# Patient Record
Sex: Female | Born: 1970 | Race: White | Hispanic: No | Smoking: Never smoker
Health system: Southern US, Community
[De-identification: ages and names within clinical notes are randomized; demographics above are authoritative.]

## PROBLEM LIST (undated history)

## (undated) DIAGNOSIS — G459 Transient cerebral ischemic attack, unspecified: Secondary | ICD-10-CM

## (undated) DIAGNOSIS — M1990 Unspecified osteoarthritis, unspecified site: Secondary | ICD-10-CM

## (undated) HISTORY — PX: OTHER SURGICAL HISTORY: SHX169

## (undated) HISTORY — DX: Unspecified osteoarthritis, unspecified site: M19.90

---

## 2002-04-29 ENCOUNTER — Inpatient Hospital Stay (HOSPITAL_COMMUNITY): Admission: AD | Admit: 2002-04-29 | Discharge: 2002-04-29 | Payer: Self-pay | Admitting: Obstetrics and Gynecology

## 2004-09-12 ENCOUNTER — Ambulatory Visit (HOSPITAL_COMMUNITY): Admission: RE | Admit: 2004-09-12 | Discharge: 2004-09-12 | Payer: Self-pay | Admitting: Family Medicine

## 2010-09-10 ENCOUNTER — Inpatient Hospital Stay (INDEPENDENT_AMBULATORY_CARE_PROVIDER_SITE_OTHER)
Admission: RE | Admit: 2010-09-10 | Discharge: 2010-09-10 | Disposition: A | Discharge status: Home / Self Care | Payer: Self-pay | Source: Ambulatory Visit | Attending: Emergency Medicine | Admitting: Emergency Medicine

## 2010-09-10 ENCOUNTER — Encounter: Payer: Self-pay | Admitting: Emergency Medicine

## 2010-09-10 DIAGNOSIS — R05 Cough: Secondary | ICD-10-CM

## 2010-12-23 NOTE — Progress Notes (Signed)
Summary: cough   Vital Signs:  Patient Profile:   40 Years Old Female CC:      dry cough x 2 weeks Height:     62 inches Weight:      125 pounds O2 Sat:      98 % O2 treatment:    Room Air Temp:     98.5 degrees F oral Pulse rate:   81 / minute Resp:     16 per minute BP sitting:   128 / 77  (left arm) Cuff size:   regular  Pt. in pain?   no  Vitals Entered By: Lajean Saver RN (September 10, 2010 10:33 AM)                   Updated Prior Medication List: No Medications Current Allergies: No known allergies History of Present Illness History from: patient Chief Complaint: dry cough x 2 weeks History of Present Illness: 40 Years Old Female complains of onset of cough 10-14 days.  Terri Garner has been using nothing OTC.  Perhaps is a little worse at night.  It is dry.  No other symptoms noted.  No history of allergic rhinitis, GERD, or asthma.  She is not getting worse, just an annoying cough at night. No sore throat + dry cough No pleuritic pain No wheezing No nasal congestion No post-nasal drainage No sinus pain/pressure No chest congestion No itchy/red eyes No earache No hemoptysis No SOB No chills/sweats No fever No nausea No vomiting No abdominal pain No diarrhea No skin rashes No fatigue No myalgias No headache    REVIEW OF SYSTEMS Constitutional Symptoms      Denies fever, chills, night sweats, weight loss, weight gain, and fatigue.  Eyes       Denies change in vision, eye pain, eye discharge, glasses, contact lenses, and eye surgery. Ear/Nose/Throat/Mouth       Denies hearing loss/aids, change in hearing, ear pain, ear discharge, dizziness, frequent runny nose, frequent nose bleeds, sinus problems, sore throat, hoarseness, and tooth pain or bleeding.  Respiratory       Complains of dry cough and shortness of breath.      Denies productive cough, wheezing, asthma, bronchitis, and emphysema/COPD.  Cardiovascular       Denies murmurs, chest pain, and  tires easily with exhertion.    Gastrointestinal       Denies stomach pain, nausea/vomiting, diarrhea, constipation, blood in bowel movements, and indigestion. Genitourniary       Denies painful urination, kidney stones, and loss of urinary control. Neurological       Denies paralysis, seizures, and fainting/blackouts. Musculoskeletal       Denies muscle pain, joint pain, joint stiffness, decreased range of motion, redness, swelling, muscle weakness, and gout.  Skin       Denies bruising, unusual mles/lumps or sores, and hair/skin or nail changes.  Psych       Denies mood changes, temper/anger issues, anxiety/stress, speech problems, depression, and sleep problems. Other Comments: Dry cough x 2 weeks. No other symptoms. 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 She was not bitten   Past History:  Past Medical History: arthritis  Past Surgical History: Denies surgical history  Social History: Never Smoked Alcohol use-no Drug use-no Smoking Status:  never Drug Use:  no Physical Exam General appearance: well developed, well nourished, no acute distress Ears: normal, no lesions or deformities Nasal: mucosa pink, nonedematous, no  septal deviation, turbinates normal Oral/Pharynx: tongue normal, posterior pharynx without erythema or exudate Chest/Lungs: no rales, wheezes, or rhonchi bilateral, breath sounds equal without effort Heart: regular rate and  rhythm, no murmur MSE: oriented to time, place, and person Assessment New Problems: COUGH (ICD-786.2)   Plan New Medications/Changes: TESSALON 200 MG CAPS (BENZONATATE) 1 by mouth three times a day as needed for cough  #21 x 0, 09/10/2010, Hoyt Koch MD  New Orders: New Patient Level III [phone] Pulse Oximetry (single measurment) [94760] Planning Comments:   No antibiotic given since likely allergic/irritant. Use nasal saline solution (over the counter) at least 3  times a day and consider an antihistamine at night. Follow up with your primary doctor  if no improvement in 5-7 days, sooner if increasing pain, fever, or new symptoms.   If not improving, can try Cheratussin vs a trial of Zpak.   She was around a horse (fully immunized) that was bleeding a few weeks ago.  She doesn't have any symptoms of brucellosis, the horse and her don't have any manifestations of pulmonary anthrax.  Perhaps early leptospirosis is possible but no other symptoms like fever.  If worsening, should be worked up with labs, CXR.   The patient and/or caregiver has been counseled thoroughly with regard to medications prescribed including dosage, schedule, interactions, rationale for use, and possible side effects and they verbalize understanding.  Diagnoses and expected course of recovery discussed and will return if not improved as expected or if the condition worsens. Patient and/or caregiver verbalized understanding.  Prescriptions: TESSALON 200 MG CAPS (BENZONATATE) 1 by mouth three times a day as needed for cough  #21 x 0   Entered and Authorized by:   Hoyt Koch MD   Signed by:   Hoyt Koch MD on 09/10/2010   Method used:   Print then Give to Patient   RxID:   [id number]   Orders Added: 1)  New Patient Level III [95621] 2)  Pulse Oximetry (single measurment) [30865]

## 2012-09-16 ENCOUNTER — Emergency Department (HOSPITAL_COMMUNITY): Payer: BC Managed Care – PPO

## 2012-09-16 ENCOUNTER — Emergency Department (HOSPITAL_BASED_OUTPATIENT_CLINIC_OR_DEPARTMENT_OTHER)
Admission: EM | Admit: 2012-09-16 | Discharge: 2012-09-16 | Disposition: A | Discharge status: Nursing Home (Includes ICF) | Payer: BC Managed Care – PPO | Source: Home / Self Care

## 2012-09-16 ENCOUNTER — Encounter (HOSPITAL_COMMUNITY): Payer: Self-pay | Admitting: *Deleted

## 2012-09-16 ENCOUNTER — Encounter (HOSPITAL_COMMUNITY): Payer: Self-pay | Admitting: Emergency Medicine

## 2012-09-16 ENCOUNTER — Observation Stay (HOSPITAL_COMMUNITY)
Admission: EM | Admit: 2012-09-16 | Discharge: 2012-09-17 | Disposition: A | Discharge status: Home / Self Care | Payer: BC Managed Care – PPO | Attending: Internal Medicine | Admitting: Internal Medicine

## 2012-09-16 DIAGNOSIS — G459 Transient cerebral ischemic attack, unspecified: Secondary | ICD-10-CM

## 2012-09-16 DIAGNOSIS — R7309 Other abnormal glucose: Secondary | ICD-10-CM | POA: Insufficient documentation

## 2012-09-16 DIAGNOSIS — R7303 Prediabetes: Secondary | ICD-10-CM | POA: Diagnosis present

## 2012-09-16 DIAGNOSIS — F449 Dissociative and conversion disorder, unspecified: Secondary | ICD-10-CM

## 2012-09-16 DIAGNOSIS — E785 Hyperlipidemia, unspecified: Secondary | ICD-10-CM | POA: Diagnosis present

## 2012-09-16 DIAGNOSIS — F447 Conversion disorder with mixed symptom presentation: Secondary | ICD-10-CM

## 2012-09-16 DIAGNOSIS — R51 Headache: Secondary | ICD-10-CM | POA: Insufficient documentation

## 2012-09-16 DIAGNOSIS — R29810 Facial weakness: Secondary | ICD-10-CM | POA: Insufficient documentation

## 2012-09-16 DIAGNOSIS — R29898 Other symptoms and signs involving the musculoskeletal system: Secondary | ICD-10-CM | POA: Insufficient documentation

## 2012-09-16 DIAGNOSIS — R4789 Other speech disturbances: Secondary | ICD-10-CM | POA: Insufficient documentation

## 2012-09-16 DIAGNOSIS — G43109 Migraine with aura, not intractable, without status migrainosus: Secondary | ICD-10-CM

## 2012-09-16 DIAGNOSIS — R110 Nausea: Secondary | ICD-10-CM | POA: Insufficient documentation

## 2012-09-16 LAB — COMPREHENSIVE METABOLIC PANEL
AST: 16 U/L (ref 0–37)
Albumin: 4.2 g/dL (ref 3.5–5.2)
Alkaline Phosphatase: 54 U/L (ref 39–117)
BUN: 12 mg/dL (ref 6–23)
Chloride: 102 mEq/L (ref 96–112)
Creatinine, Ser: 0.54 mg/dL (ref 0.50–1.10)
GFR calc Af Amer: >90 mL/min (ref >90)
Potassium: 3.5 mEq/L (ref 3.5–5.1)
Total Bilirubin: 0.4 mg/dL (ref 0.3–1.2)
Total Protein: 7.6 g/dL (ref 6.0–8.3)

## 2012-09-16 LAB — CBC WITH DIFFERENTIAL/PLATELET
Eosinophils Absolute: 0.1 10*3/uL (ref 0.0–0.7)
Lymphocytes Relative: 24 % (ref 12–46)
MCV: 90.1 fL (ref 78.0–100.0)
Monocytes Absolute: 0.8 10*3/uL (ref 0.1–1.0)
Monocytes Relative: 9 % (ref 3–12)
Neutro Abs: 5.5 10*3/uL (ref 1.7–7.7)
Neutrophils Relative %: 65 % (ref 43–77)
RBC: 4.55 MIL/uL (ref 3.87–5.11)

## 2012-09-16 LAB — POCT PREGNANCY, URINE: Preg Test, Ur: NEGATIVE

## 2012-09-16 NOTE — ED Notes (Signed)
I called [facility] and spoke to [employer]. He said her initial BP was 158/102 and came down to 128/86.  Her CBG was 132. Pt. notified of these results.

## 2012-09-16 NOTE — ED Provider Notes (Signed)
Medical screening examination/treatment/procedure(s) were performed by resident physician or non-physician practitioner and as supervising physician I was immediately available for consultation/collaboration.   Laiklyn Pilkenton DOUGLAS MD.   Aeson Sawyers D Layth Cerezo, MD 09/16/12 2052 

## 2012-09-16 NOTE — ED Provider Notes (Signed)
CSN: [id number]     Arrival date & time 09/16/12  1655 History   None    Chief Complaint  Patient presents with  . Near Syncope  . Spasms  . Headache   (Consider location/radiation/quality/duration/timing/severity/associated sxs/prior Treatment) HPI  Today developed a queezy stomach around 12:40 . Developed HA shortly thereafter on the back L side of head. Felt like she was going to pass out. 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  Started have intermitten arms, hands, shoulders, back, and leg muscle spasms that occurred in a controlled fashion. Remembers entire event. Told that L side of face started to droop. No h/o previous occurrence. Symptoms started to resolve around 1:30. HA returning. Denies any recent gfevers, night sweats, unexplained wt change, n/v/d/c, CP, palpitations, LOC, SOB. No photo/phono sensitivity.  Endorses ~ 6 hours of sleep a night lately which is improved over typical amount. Denies any coffee use 2 cans of Dr pepper a day. No recent vitamin or medication use/changes.      History reviewed. No pertinent past medical history. History reviewed. No pertinent past surgical history. Family History  Problem Relation Age of Onset  . Diabetes Father   . Heart attack Father    History  Substance Use Topics  . Smoking status: Never Smoker   . Smokeless tobacco: Not on file  . Alcohol Use: No   OB History   Grav Para Term Preterm Abortions TAB SAB Ect Mult Living                 Review of Systems  All other systems reviewed and are negative.    Allergies  Review of patient's allergies indicates no known allergies.  Home Medications   Current Outpatient Rx  Name  Route  Sig  Dispense  Refill  . ibuprofen (ADVIL,MOTRIN) 200 MG tablet   Oral   Take 400 mg by mouth once.          BP 131/85  Pulse 71  Temp(Src) 97.8 F (36.6 C) (Oral)  Resp 20  SpO2 100%  LMP 09/12/2012 Physical Exam  Constitutional: She is oriented  to person, place, and time. She appears well-developed and well-nourished. No distress.  HENT:  Head: Normocephalic and atraumatic.  Mouth/Throat: Oropharynx is clear and moist.  Eyes: Conjunctivae and EOM are normal. Pupils are equal, round, and reactive to light.  Neck: Normal range of motion. No JVD present. No thyromegaly present.  Cardiovascular: Normal rate, normal heart sounds and intact distal pulses.   Pulmonary/Chest: Effort normal and breath sounds normal. No respiratory distress. She has no wheezes. She has no rales. She exhibits no tenderness.  Abdominal: Soft. Bowel sounds are normal. She exhibits no distension. There is no tenderness.  Musculoskeletal: Normal range of motion. She exhibits no tenderness.  Lymphadenopathy:    She has no cervical adenopathy.  Neurological: She is alert and oriented to person, place, and time. No cranial nerve deficit.  Rhomberg neg, proprioception nml, cerebellar fxn intact. EOMI, PERRL.   Skin: Skin is warm and dry. No rash noted. She is not diaphoretic. No erythema. No pallor.  Psychiatric: She has a normal mood and affect. Her behavior is normal. Judgment and thought content normal.    ED Course  Procedures (including critical care time) Labs Review Labs Reviewed - No data to display Imaging Review No results found.  MDM  No diagnosis found. 42yo F w/ likely psychosomatic or conversion disorder vs migraine. No signs or symptoms  of stroke or seizure as no postictal state and pt w/o any deficits. 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  - Pt would like further workup for reasurrance at [facility] ED. - transfer to [facility] ED  Shelly Flatten, MD Family Medicine PGY-3 09/16/2012, 6:43 PM   Spent more than in direct pt care and counseling    Ozella Rocks, MD 09/16/12 1846

## 2012-09-16 NOTE — ED Notes (Signed)
Consc., alert, oriented x 4 and amb., PERL, equal smile, good bil. grip, no arm drift.

## 2012-09-16 NOTE — ED Notes (Signed)
PT. TRANSFERRED FROM [facility] THIS EVENING , REPORTS HEADACHE , NAUSEA , DIZZINESS WITH BRIEF LEFT FACIAL DROOP ONSET 12 NOON TODAY , SPEECH CLEAR / NO FACIAL ASYMMETRY , EQUAL STRONG GRIPS , NO ARM DRIFT /AMBULATORY . EKG DONE AT URGENT CARE.

## 2012-09-16 NOTE — ED Notes (Signed)
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  When another teacher said her name, she could not respond.  Legs were elevated.  Started feeling muscle spasms all over her body and L side of face drooped. EMS was called and evaluate her.  Drooping in her face resolved and she refused transport. Muscle shaking resolved in 1 hr.  She took 2 Advill @ 1400 with partial relief.  No nausea now but still has slight headache.

## 2012-09-16 NOTE — ED Provider Notes (Signed)
CSN: [id number]     Arrival date & time 09/16/12  1903 History   First MD Initiated Contact with Patient 09/16/12 2339     Chief Complaint  Patient presents with  . Nausea  . Headache   (Consider location/radiation/quality/duration/timing/severity/associated sxs/prior Treatment) HPI History provided by patient. She is otherwise healthy, denies any medications or history of migraine headaches. She works as a Engineer, site, Product manager. Today around 10 AM developed nausea and left-sided posterior headache. Gradual onset it became severe around 1 PM. She denies thunderclap headache. She denies worse headache of life. She denies any neck pain or neck stiffness. She was getting ready for a medium and colleagues cold her that she had left facial droop. At that time she was unable to speak but she could hear people talking around her. Those symptoms lasted a matter of minutes and resolved. EMS was called and patient declined transport. She later went to urgent care and was referred here for further workup. Her headache is now 4/10 and she declines any medications for this. Nausea is resolved. No associated fevers, recent illness, recent travel. No weakness or numbness otherwise. No history of same  History reviewed. No pertinent past medical history. History reviewed. No pertinent past surgical history. Family History  Problem Relation Age of Onset  . Diabetes Father   . Heart attack Father    History  Substance Use Topics  . Smoking status: Never Smoker   . Smokeless tobacco: Not on file  . Alcohol Use: No   OB History   Grav Para Term Preterm Abortions TAB SAB Ect Mult Living                 Review of Systems  Constitutional: Negative for fever and chills.  HENT: Negative for neck pain and neck stiffness.   Eyes: Negative for pain.  Respiratory: Negative for shortness of breath.   Cardiovascular: Negative for chest pain.  Gastrointestinal: Negative for abdominal pain.   Genitourinary: Negative for dysuria.  Musculoskeletal: Negative for back pain.  Skin: Negative for rash.  Neurological: Negative for headaches.  All other systems reviewed and are negative.    Allergies  Review of patient's allergies indicates no known allergies.  Home Medications   Current Outpatient Rx  Name  Route  Sig  Dispense  Refill  . ibuprofen (ADVIL,MOTRIN) 200 MG tablet   Oral   Take 400 mg by mouth once.          BP 129/77  Pulse 65  Temp(Src) 97.5 F (36.4 C) (Oral)  Resp 16  SpO2 99%  LMP 09/12/2012 Physical Exam  Constitutional: She is oriented to person, place, and time. She appears well-developed and well-nourished.  HENT:  Head: Normocephalic and atraumatic.  Eyes: Conjunctivae and EOM are normal. Pupils are equal, round, and reactive to light.  Neck: Full passive range of motion without pain. Neck supple. No thyromegaly present.  No meningismus  Cardiovascular: Normal rate, regular rhythm, S1 normal, S2 normal and intact distal pulses.   Pulmonary/Chest: Effort normal and breath sounds normal.  Abdominal: Soft. Bowel sounds are normal. There is no tenderness. There is no CVA tenderness.  Musculoskeletal: Normal range of motion.  Neurological: She is alert and oriented to person, place, and time. She has normal strength and normal reflexes. No cranial nerve deficit or sensory deficit. She displays a negative Romberg sign. GCS eye subscore is 4. GCS verbal subscore is 5. GCS motor subscore is 6.  Normal Gait  Skin: Skin  is warm and dry. No rash noted. No cyanosis. Nails show no clubbing.  Psychiatric: She has a normal mood and affect. Her speech is normal and behavior is normal.    ED Course  Procedures (including critical care time) Labs Review  Results for orders placed during the hospital encounter of 09/16/12  CBC WITH DIFFERENTIAL      Result Value Range   WBC 8.4  4.0 - 10.5 K/uL   RBC 4.55  3.87 - 5.11 MIL/uL   Hemoglobin 14.1  12.0 -  15.0 g/dL   HCT 62.9  52.8 - 41.3 %   MCV 90.1  78.0 - 100.0 fL   MCH 31.0  26.0 - 34.0 pg   MCHC 34.4  30.0 - 36.0 g/dL   RDW 24.4  01.0 - 27.2 %   Platelets 332  150 - 400 K/uL   Neutrophils Relative % 65  43 - 77 %   Neutro Abs 5.5  1.7 - 7.7 K/uL   Lymphocytes Relative 24  12 - 46 %   Lymphs Abs 2.0  0.7 - 4.0 K/uL   Monocytes Relative 9  3 - 12 %   Monocytes Absolute 0.8  0.1 - 1.0 K/uL   Eosinophils Relative 1  0 - 5 %   Eosinophils Absolute 0.1  0.0 - 0.7 K/uL   Basophils Relative 1  0 - 1 %   Basophils Absolute 0.0  0.0 - 0.1 K/uL  COMPREHENSIVE METABOLIC PANEL      Result Value Range   Sodium 139  135 - 145 mEq/L   Potassium 3.5  3.5 - 5.1 mEq/L   Chloride 102  96 - 112 mEq/L   CO2 26  19 - 32 mEq/L   Glucose, Bld 93  70 - 99 mg/dL   BUN 12  6 - 23 mg/dL   Creatinine, Ser 5.36  0.50 - 1.10 mg/dL   Calcium 9.4  8.4 - 64.4 mg/dL   Total Protein 7.6  6.0 - 8.3 g/dL   Albumin 4.2  3.5 - 5.2 g/dL   AST 16  0 - 37 U/L   ALT 8  0 - 35 U/L   Alkaline Phosphatase 54  39 - 117 U/L   Total Bilirubin 0.4  0.3 - 1.2 mg/dL   GFR calc non Af Amer >90  >90 mL/min   GFR calc Af Amer >90  >90 mL/min  POCT PREGNANCY, URINE      Result Value Range   Preg Test, Ur NEGATIVE  NEGATIVE   Ct Head (brain) Wo Contrast  09/16/2012   *RADIOLOGY REPORT*  Clinical Data:  Nausea, left posterior headache, pain below eyes, near-syncope, episode of aphasia  CT HEAD WITHOUT CONTRAST  Technique:  Contiguous axial images were obtained from the base of the skull through the vertex without contrast.  Comparison: None  Findings: Normal ventricular morphology. No midline shift or mass effect. Normal appearance of brain parenchyma. No intracranial hemorrhage, mass lesion, or acute infarction. Visualized paranasal sinuses and mastoid air cells clear. Bones unremarkable.  IMPRESSION: No acute intracranial abnormalities.   Original Report Authenticated By: Ulyses Southward, M.D.     Date: 09/16/2012  Rate: 75   Rhythm: normal sinus rhythm  QRS Axis: normal  Intervals: normal  ST/T Wave abnormalities: nonspecific ST changes  Conduction Disutrbances:none  Narrative Interpretation:   Old EKG Reviewed: none available  ASA provided  12:15 AM d/w NEU on call as above, DR Leroy Kennedy agrees with admit TIA work up. MED  consult requested. 12:36 AM d/w Toniann Fail, will admit MDM  Dx: TIA, HA, nausea  CT brain, labs, ECG Serial exams no deficits  MED admit     Sunnie Nielsen, MD 09/17/12 [phone]

## 2012-09-17 ENCOUNTER — Observation Stay (HOSPITAL_COMMUNITY): Payer: BC Managed Care – PPO

## 2012-09-17 ENCOUNTER — Encounter (HOSPITAL_COMMUNITY): Payer: Self-pay | Admitting: *Deleted

## 2012-09-17 DIAGNOSIS — G459 Transient cerebral ischemic attack, unspecified: Secondary | ICD-10-CM

## 2012-09-17 DIAGNOSIS — G43109 Migraine with aura, not intractable, without status migrainosus: Secondary | ICD-10-CM

## 2012-09-17 DIAGNOSIS — R7309 Other abnormal glucose: Secondary | ICD-10-CM

## 2012-09-17 DIAGNOSIS — R7303 Prediabetes: Secondary | ICD-10-CM | POA: Diagnosis present

## 2012-09-17 DIAGNOSIS — E785 Hyperlipidemia, unspecified: Secondary | ICD-10-CM

## 2012-09-17 LAB — CBC WITH DIFFERENTIAL/PLATELET
Hemoglobin: 12.9 g/dL (ref 12.0–15.0)
Lymphs Abs: 1.7 10*3/uL (ref 0.7–4.0)
Monocytes Relative: 17 % — ABNORMAL HIGH (ref 3–12)
Neutro Abs: 3.4 10*3/uL (ref 1.7–7.7)
Neutrophils Relative %: 54 % (ref 43–77)
RBC: 4.1 MIL/uL (ref 3.87–5.11)
WBC: 6.2 10*3/uL (ref 4.0–10.5)

## 2012-09-17 LAB — RAPID URINE DRUG SCREEN, HOSP PERFORMED
Amphetamines: NOT DETECTED
Benzodiazepines: NOT DETECTED
Opiates: NOT DETECTED

## 2012-09-17 LAB — BASIC METABOLIC PANEL
GFR calc Af Amer: >90 mL/min (ref >90)
GFR calc non Af Amer: >90 mL/min (ref >90)
Potassium: 3.7 mEq/L (ref 3.5–5.1)
Sodium: 139 mEq/L (ref 135–145)

## 2012-09-17 LAB — LIPID PANEL
LDL Cholesterol: 107 mg/dL — ABNORMAL HIGH (ref 0–99)
Total CHOL/HDL Ratio: 3.3 RATIO
Triglycerides: 51 mg/dL (ref <150)
VLDL: 10 mg/dL (ref 0–40)

## 2012-09-17 LAB — HEMOGLOBIN A1C: Mean Plasma Glucose: 117 mg/dL — ABNORMAL HIGH (ref <117)

## 2012-09-17 MED ORDER — ACETAMINOPHEN 325 MG PO TABS: 650.0000 mg | ORAL_TABLET | ORAL | Status: DC | PRN

## 2012-09-17 MED ORDER — ENOXAPARIN SODIUM 40 MG/0.4ML ~~LOC~~ SOLN
40.0000 mg | SUBCUTANEOUS | Status: DC
  Administered 2012-09-17: 40 mg via SUBCUTANEOUS
  Filled 2012-09-17: qty 0.4

## 2012-09-17 MED ORDER — ASPIRIN EC 81 MG PO TBEC: 81.0000 mg | DELAYED_RELEASE_TABLET | Freq: Every day | ORAL | Status: DC

## 2012-09-17 MED ORDER — SIMVASTATIN 10 MG PO TABS
10.0000 mg | ORAL_TABLET | Freq: Every day | ORAL | Status: DC
  Filled 2012-09-17: qty 1

## 2012-09-17 MED ORDER — GADOBENATE DIMEGLUMINE 529 MG/ML IV SOLN
15.0000 mL | Freq: Once | INTRAVENOUS | Status: AC
  Administered 2012-09-17: 12 mL via INTRAVENOUS

## 2012-09-17 MED ORDER — ASPIRIN 325 MG PO TABS
325.0000 mg | ORAL_TABLET | Freq: Every day | ORAL | Status: DC
  Administered 2012-09-17: 325 mg via ORAL
  Filled 2012-09-17: qty 1

## 2012-09-17 MED ORDER — ASPIRIN 81 MG PO CHEW
162.0000 mg | CHEWABLE_TABLET | Freq: Once | ORAL | Status: AC
  Administered 2012-09-17: 162 mg via ORAL
  Filled 2012-09-17: qty 2

## 2012-09-17 MED ORDER — SODIUM CHLORIDE 0.9 % IV SOLN: INTRAVENOUS | Status: DC

## 2012-09-17 NOTE — Discharge Summary (Addendum)
Physician Discharge Summary  Rivka Baune GEX:[id number] DOB: 1970-02-23 DOA: 09/16/2012  PCP: Loleta Dicker, FNP  Admit date: 09/16/2012 Discharge date: 09/17/2012  Time spent: 20 minutes  Recommendations for Outpatient Follow-up:  Home with outpt PCP follow up  Discharge Diagnoses:  Principal Problem:   TIA (transient ischemic attack)  Active Problems:   Prediabetes   Other and unspecified hyperlipidemia   Discharge Condition: fair  Diet recommendation: cardiac  Filed Weights   09/17/12 0219  Weight: 58.968 kg (130 lb)    History of present illness:  42 y.o. female with no significant past medical history started experiencing left facial droop and difficulty speaking at around 1 PM yesterday. Just prior to the incident patient had some occipital headache with nausea. Patient eventually started developing some clumsiness and weakness of the left upper and lower extremity though she did not lose function and strength. Her left facial droop and difficulty speaking resolved within a few minutes. Her left upper and lower extremity weakness resolved within an hour. Headache persisted. Patient came to the ER by then patient was nonfocal. CT head was negative for anything acute. Patient has been admitted for further workup for possible TIA. Presently patient only has mild headache. Denies any chest pain shortness of breath palpitations. Denies any similar episodes before.   Hospital Course:  patient admitted to telemetry. CVA w/up done including head C T , MRI brain, MRA head carotid doppler all unremarkable. Noted to have jild hyperlipidemia and borderline diabetes with A1C of 5.7. 2D echo done and results pending.  patient does not  Have any further symptoms. Unclear if this is due to TIA vs migraine like symptoms. Will place on baby aspirin upon discharge. She is conuseled on monitoring her diet given borderline diabetes and mild hyperlipidemia. She is encouraged to exercise regularly and  follow up with her PCP in 1 week. Stable for discharge home with outpt follow up.  i will follow up her echo results.   Procedures:  none  Consultations:  neurology  Discharge Exam: Filed Vitals:   09/17/12 1414  BP: 115/72  Pulse: 74  Temp: 98.5 F (36.9 C)  Resp: 20    General: middle aged female in NAD HEENT: no pallor, moist oral mucosa chest: clear b/l  CVS: NS1&S2, no murmurs Abd: soft, NT, ND, BS+ Ext: warm, no edema CNS: AAOX3, non focal   Discharge Instructions     Medication List    ASK your doctor about these medications       ibuprofen 200 MG tablet  Commonly known as:  ADVIL,MOTRIN  Take 400 mg by mouth once.      take these medications  aspirin 81 mg po 1 tablet daily  No Known Allergies     Follow-up Information   Follow up with JUDGE,ERIN, FNP. Schedule an appointment as soon as possible for a visit in 1 week.   Specialty:  Family Medicine   Contact information:   [address] [place], [place] [address] [place] [address] [phone]        The results of significant diagnostics from this hospitalization (including imaging, microbiology, ancillary and laboratory) are listed below for reference.    Significant Diagnostic Studies: Ct Head (brain) Wo Contrast  09/16/2012   *RADIOLOGY REPORT*  Clinical Data:  Nausea, left posterior headache, pain below eyes, near-syncope, episode of aphasia  CT HEAD WITHOUT CONTRAST  Technique:  Contiguous axial images were obtained from the base of the skull through the vertex without contrast.  Comparison:  None  Findings: Normal ventricular morphology. No midline shift or mass effect. Normal appearance of brain parenchyma. No intracranial hemorrhage, mass lesion, or acute infarction. Visualized paranasal sinuses and mastoid air cells clear. Bones unremarkable.  IMPRESSION: No acute intracranial abnormalities.   Original Report Authenticated By: Ulyses Southward, M.D.   Mr Laqueta Jean Wo Contrast  09/17/2012    *RADIOLOGY REPORT*  Clinical Data:  Left facial numbness and drooping.  TIA  MRI HEAD WITHOUT AND WITH CONTRAST MRA HEAD WITHOUT CONTRAST  Technique:  Multiplanar, multiecho pulse sequences of the brain and surrounding structures were obtained without and with intravenous contrast.  Angiographic images of the head were obtained using MRA technique without contrast.  Contrast: 12mL MULTIHANCE GADOBENATE DIMEGLUMINE 529 MG/ML IV SOLN  Comparison:  CT 09/16/2012  MRI HEAD WITHOUT AND WITH CONTRAST  Findings:  Negative for acute infarct.  No significant chronic ischemia.  Ventricle size is normal.  Craniocervical junction is normal. Pituitary is normal in size.  No evidence of demyelinating disease.  Negative for hemorrhage or fluid collection.  Negative for mass or edema.  Normal enhancement following contrast infusion.  Mucous retention cyst in the right maxillary sinus.  IMPRESSION: No significant intracranial abnormality, no acute abnormality  Retention cyst right maxillary sinus.  MRA HEAD  Findings: Both vertebral arteries are patent to the basilar.  PICA is patent bilaterally.  The basilar is widely patent.  AICA, superior cerebellar, and posterior cerebral arteries are normal.  Internal carotid artery is normal bilaterally.  Anterior and middle cerebral arteries are normal.  Negative for cerebral aneurysm.  IMPRESSION: Normal intracranial MRA.   Original Report Authenticated By: Janeece Riggers, M.D.   Mr Mra Head/brain Wo Cm  09/17/2012   *RADIOLOGY REPORT*  Clinical Data:  Left facial numbness and drooping.  TIA  MRI HEAD WITHOUT AND WITH CONTRAST MRA HEAD WITHOUT CONTRAST  Technique:  Multiplanar, multiecho pulse sequences of the brain and surrounding structures were obtained without and with intravenous contrast.  Angiographic images of the head were obtained using MRA technique without contrast.  Contrast: 12mL MULTIHANCE GADOBENATE DIMEGLUMINE 529 MG/ML IV SOLN  Comparison:  CT 09/16/2012  MRI HEAD WITHOUT  AND WITH CONTRAST  Findings:  Negative for acute infarct.  No significant chronic ischemia.  Ventricle size is normal.  Craniocervical junction is normal. Pituitary is normal in size.  No evidence of demyelinating disease.  Negative for hemorrhage or fluid collection.  Negative for mass or edema.  Normal enhancement following contrast infusion.  Mucous retention cyst in the right maxillary sinus.  IMPRESSION: No significant intracranial abnormality, no acute abnormality  Retention cyst right maxillary sinus.  MRA HEAD  Findings: Both vertebral arteries are patent to the basilar.  PICA is patent bilaterally.  The basilar is widely patent.  AICA, superior cerebellar, and posterior cerebral arteries are normal.  Internal carotid artery is normal bilaterally.  Anterior and middle cerebral arteries are normal.  Negative for cerebral aneurysm.  IMPRESSION: Normal intracranial MRA.   Original Report Authenticated By: Janeece Riggers, M.D.    Microbiology: No results found for this or any previous visit (from the past 240 hour(s)).   Labs: Basic Metabolic Panel:  Recent Labs Lab 09/16/12 1924 09/17/12 0500  NA 139 139  K 3.5 3.7  CL 102 104  CO2 26 27  GLUCOSE 93 98  BUN 12 15  CREATININE 0.54 0.66  CALCIUM 9.4 9.0   Liver Function Tests:  Recent Labs Lab 09/16/12 1924  AST 16  ALT  8  ALKPHOS 54  BILITOT 0.4  PROT 7.6  ALBUMIN 4.2   No results found for this basename: LIPASE, AMYLASE,  in the last 168 hours No results found for this basename: AMMONIA,  in the last 168 hours CBC:  Recent Labs Lab 09/16/12 1924 09/17/12 0500  WBC 8.4 6.2  NEUTROABS 5.5 3.4  HGB 14.1 12.9  HCT 41.0 37.0  MCV 90.1 90.2  PLT 332 279   Cardiac Enzymes: No results found for this basename: CKTOTAL, CKMB, CKMBINDEX, TROPONINI,  in the last 168 hours BNP: BNP (last 3 results) No results found for this basename: PROBNP,  in the last 8760 hours CBG: No results found for this basename: GLUCAP,  in the  last 168 hours     Signed:  Eddie North  [facility] 09/17/2012, 4:48 PM

## 2012-09-17 NOTE — Progress Notes (Signed)
Patient is discharged from room 4N24 at this time. In stable condition and mother at side. IV and tele d/c'd.

## 2012-09-17 NOTE — Progress Notes (Signed)
  Echocardiogram 2D Echocardiogram has been performed.  Terri Garner 09/17/2012, 4:09 PM

## 2012-09-17 NOTE — H&P (Signed)
[facility] History and Physical  Terri Garner ZOX:[id number] DOB: [birth year] DOA: 09/16/2012  Referring physician: ER physician. PCP: No PCP Per Patient   Chief Complaint: left facial droop and difficulty speaking.  HPI: Terri Garner is a 42 y.o. female with no significant past medical history started experiencing left facial droop and difficulty speaking at around 1 PM yesterday. Just prior to the incident patient had some occipital headache with nausea. Patient eventually started developing some clumsiness and weakness of the left upper and lower extremity though she did not lose function and strength. Her left facial droop and difficulty speaking resolved within a few minutes. Her left upper and lower extremity weakness resolved within an hour. Headache persisted. Patient came to the ER by then patient was nonfocal. CT head was negative for anything acute. Patient has been admitted for further workup for possible TIA. Presently patient only has mild headache. Denies any chest pain shortness of breath palpitations. Denies any similar episodes before.  Review of Systems: As presented in the history of presenting illness, rest negative.  History reviewed. No pertinent past medical history. History reviewed. No pertinent past surgical history. Social History:  reports that she has never smoked. She does not have any smokeless tobacco history on file. She reports that she does not drink alcohol or use illicit drugs. Home. where does patient live-- Can do ADLs. Can patient participate in ADLs?  No Known Allergies  Family History  Problem Relation Age of Onset  . Diabetes Father   . Heart attack Father       Prior to Admission medications   Medication Sig Start Date End Date Taking? Authorizing Provider  ibuprofen (ADVIL,MOTRIN) 200 MG tablet Take 400 mg by mouth once.   Yes Historical Provider, MD   Physical Exam: Filed Vitals:   09/17/12 0115 09/17/12 0130 09/17/12 0211  09/17/12 0219  BP: 118/81 137/86 121/79   Pulse: 69 81 71   Temp:   98.4 F (36.9 C)   TempSrc:   Oral   Resp:   20   Height:    5\' 2"  (1.575 m)  Weight:    58.968 kg (130 lb)  SpO2: 96% 97% 99%      General:  Well-developed and nourished.  Eyes: anicteric no pallor.  ENT: no discharge from ears eyes nose mouth.  Neck: no mass felt.  Cardiovascular: S1-S2 heard.  Respiratory: no rhonchi or crepitations.  Abdomen: soft nontender bowel sounds present.  Skin: no rash.  Musculoskeletal: S1-S2 heard.  Psychiatric: appears normal.  Neurologic: alert awake oriented to time place and person. Moves all extremities 5 x 5. No facial asymmetry. Tongue is midline. PERRLA positive.  Labs on Admission:  Basic Metabolic Panel:  Recent Labs Lab 09/16/12 1924  NA 139  K 3.5  CL 102  CO2 26  GLUCOSE 93  BUN 12  CREATININE 0.54  CALCIUM 9.4   Liver Function Tests:  Recent Labs Lab 09/16/12 1924  AST 16  ALT 8  ALKPHOS 54  BILITOT 0.4  PROT 7.6  ALBUMIN 4.2   No results found for this basename: LIPASE, AMYLASE,  in the last 168 hours No results found for this basename: AMMONIA,  in the last 168 hours CBC:  Recent Labs Lab 09/16/12 1924  WBC 8.4  NEUTROABS 5.5  HGB 14.1  HCT 41.0  MCV 90.1  PLT 332   Cardiac Enzymes: No results found for this basename: CKTOTAL, CKMB, CKMBINDEX, TROPONINI,  in the last 168 hours  BNP (  last 3 results) No results found for this basename: PROBNP,  in the last 8760 hours CBG: No results found for this basename: GLUCAP,  in the last 168 hours  Radiological Exams on Admission: Ct Head (brain) Wo Contrast  09/16/2012   *RADIOLOGY REPORT*  Clinical Data:  Nausea, left posterior headache, pain below eyes, near-syncope, episode of aphasia  CT HEAD WITHOUT CONTRAST  Technique:  Contiguous axial images were obtained from the base of the skull through the vertex without contrast.  Comparison: None  Findings: Normal ventricular  morphology. No midline shift or mass effect. Normal appearance of brain parenchyma. No intracranial hemorrhage, mass lesion, or acute infarction. Visualized paranasal sinuses and mastoid air cells clear. Bones unremarkable.  IMPRESSION: No acute intracranial abnormalities.   Original Report Authenticated By: Ulyses Southward, M.D.     Assessment/Plan Principal Problem:   TIA (transient ischemic attack)   1. Possible TIA - I have discussed with on-call neurologist Dr. Cyril Mourning. Patient at this time will be placed on neurochecks and swallow evaluation. Dr. Cyril Mourning has advised to get MRI brain with and without contrast. Check 2-D echo and carotid Doppler and monitor in telemetry for any arrhythmias. Further recommendations per neurologist.    Code Status: full code.  Family Communication: none.  Disposition Plan: admit for observation.    Mira Balon N. [facility] Pager [phone].  If 7PM-7AM, please contact night-coverage [email or web address] Password [facility] 09/17/2012, 2:54 AM

## 2012-09-17 NOTE — Consult Note (Addendum)
NEURO HOSPITALIST CONSULT NOTE    Reason for Consult: transient HA, nausea, left face droop, slurred speech. TIA?  HPI:                                                                                                                                          Terri Garner is an 42 y.o. female without significant past medical history, comes in for further evaluation of the above stated symptoms. 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 Then, she noted that she was able to hear people talking and was aware of her surroundings but couldn't speak properly, her speech was slurred, and her coworkers noted some droopiness of the left side of her face. Never had similar symptoms before and emphasizes that she never gets headache. She said that the headache went away quickly but came back later and the speech difficulty and face droopiness lasted only for few minutes. No recent head or neck trauma, fever, or infection. No vertigo, double vision, focal weakness or numbness, or visual disturbances. CT brain upon arrival to the hospital was unremarkable. Presently, complains of mild pain back of the head.  History reviewed. No pertinent past medical history.  History reviewed. No pertinent past surgical history.  Family History  Problem Relation Age of Onset  . Diabetes Father   . Heart attack Father     Social History:  reports that she has never smoked. She does not have any smokeless tobacco history on file. She reports that she does not drink alcohol or use illicit drugs.  No Known Allergies  MEDICATIONS:                                                                                                                     I have reviewed the  patient's current medications.   ROS:  History obtained from the patient and chart review.  General ROS: negative for - chills, fatigue, fever, night sweats, weight gain or weight loss Psychological ROS: negative for - behavioral disorder, hallucinations, memory difficulties, mood swings or suicidal ideation Ophthalmic ROS: negative for - blurry vision, double vision, eye pain or loss of vision ENT ROS: negative for - epistaxis, nasal discharge, oral lesions, sore throat, tinnitus or vertigo Allergy and Immunology ROS: negative for - hives or itchy/watery eyes Hematological and Lymphatic ROS: negative for - bleeding problems, bruising or swollen lymph nodes Endocrine ROS: negative for - galactorrhea, hair pattern changes, polydipsia/polyuria or temperature intolerance Respiratory ROS: negative for - cough, hemoptysis, shortness of breath or wheezing Cardiovascular ROS: negative for - chest pain, dyspnea on exertion, edema or irregular heartbeat Gastrointestinal ROS: negative for - abdominal pain, diarrhea, hematemesis, nausea/vomiting or stool incontinence Genito-Urinary ROS: negative for - dysuria, hematuria, incontinence or urinary frequency/urgency Musculoskeletal ROS: negative for - joint swelling or muscular weakness Neurological ROS: as noted in HPI Dermatological ROS: negative for rash and skin lesion changes      Physical exam: pleasant female in no apparent distress. Blood pressure 134/89, pulse 79, temperature 98.5 F (36.9 C), temperature source Oral, resp. rate 18, last menstrual period 09/12/2012, SpO2 97.00%. Head: normocephalic. Neck: supple, no bruits, no JVD. Cardiac: no murmurs. Lungs: clear. Abdomen: soft, no tender, no mass. Extremities: no edema.   Neurologic Examination:                                                                                                       Mental Status: Alert, awake, oriented x 4, thought content appropriate. Comprehension, naming, and repetition intact. Speech fluent without evidence of aphasia.  Cranial Nerves: II: Discs flat bilaterally; Visual fields grossly normal, pupils equal, round, reactive to light and accommodation III,IV, VI: ptosis not present, extra-ocular motions intact bilaterally V,VII: smile symmetric, facial light touch sensation normal bilaterally VIII: hearing normal bilaterally IX,X: gag reflex present XI: bilateral shoulder shrug XII: midline tongue extension Motor: Right : Upper extremity   5/5    Left:     Upper extremity   5/5  Lower extremity   5/5     Lower extremity   5/5 Tone and bulk:normal tone throughout; no atrophy noted Sensory: Pinprick and light touch intact throughout, bilaterally Deep Tendon Reflexes:  2+ bilateral UE. Brisk knee jerk bilaterally.  Plantars: Right: downgoing   Left: downgoing Cerebellar: normal finger-to-nose,  normal heel-to-shin test Gait:  No ataxia. CV: pulses palpable throughout  No meningeal signs.  No results found for this basename: cbc, bmp, coags, chol, tri, ldl, hga1c    Results for orders placed during the hospital encounter of 09/16/12 (from the past 48 hour(s))  CBC WITH DIFFERENTIAL     Status: None   Collection Time    09/16/12  7:24 PM      Result Value Range   WBC 8.4  4.0 - 10.5 K/uL   RBC 4.55  3.87 - 5.11 MIL/uL   Hemoglobin 14.1  12.0 - 15.0 g/dL   HCT  41.0  36.0 - 46.0 %   MCV 90.1  78.0 - 100.0 fL   MCH 31.0  26.0 - 34.0 pg   MCHC 34.4  30.0 - 36.0 g/dL   RDW 04.5  40.9 - 81.1 %   Platelets 332  150 - 400 K/uL   Neutrophils Relative % 65  43 - 77 %   Neutro Abs 5.5  1.7 - 7.7 K/uL   Lymphocytes Relative 24  12 - 46 %   Lymphs Abs 2.0  0.7 - 4.0 K/uL   Monocytes Relative 9  3 - 12 %   Monocytes Absolute 0.8  0.1 - 1.0 K/uL   Eosinophils Relative 1  0 - 5 %   Eosinophils Absolute  0.1  0.0 - 0.7 K/uL   Basophils Relative 1  0 - 1 %   Basophils Absolute 0.0  0.0 - 0.1 K/uL  COMPREHENSIVE METABOLIC PANEL     Status: None   Collection Time    09/16/12  7:24 PM      Result Value Range   Sodium 139  135 - 145 mEq/L   Potassium 3.5  3.5 - 5.1 mEq/L   Chloride 102  96 - 112 mEq/L   CO2 26  19 - 32 mEq/L   Glucose, Bld 93  70 - 99 mg/dL   BUN 12  6 - 23 mg/dL   Creatinine, Ser 9.14  0.50 - 1.10 mg/dL   Calcium 9.4  8.4 - 78.2 mg/dL   Total Protein 7.6  6.0 - 8.3 g/dL   Albumin 4.2  3.5 - 5.2 g/dL   AST 16  0 - 37 U/L   ALT 8  0 - 35 U/L   Alkaline Phosphatase 54  39 - 117 U/L   Total Bilirubin 0.4  0.3 - 1.2 mg/dL   GFR calc non Af Amer >90  >90 mL/min   GFR calc Af Amer >90  >90 mL/min   Comment: (NOTE)     The eGFR has been calculated using the CKD EPI equation.     This calculation has not been validated in all clinical situations.     eGFR's persistently <90 mL/min signify possible Chronic Kidney     Disease.  POCT PREGNANCY, URINE     Status: None   Collection Time    09/16/12  7:32 PM      Result Value Range   Preg Test, Ur NEGATIVE  NEGATIVE   Comment:            THE SENSITIVITY OF THIS     METHODOLOGY IS >24 mIU/mL    Ct Head (brain) Wo Contrast  09/16/2012   *RADIOLOGY REPORT*  Clinical Data:  Nausea, left posterior headache, pain below eyes, near-syncope, episode of aphasia  CT HEAD WITHOUT CONTRAST  Technique:  Contiguous axial images were obtained from the base of the skull through the vertex without contrast.  Comparison: None  Findings: Normal ventricular morphology. No midline shift or mass effect. Normal appearance of brain parenchyma. No intracranial hemorrhage, mass lesion, or acute infarction. Visualized paranasal sinuses and mastoid air cells clear. Bones unremarkable.  IMPRESSION: No acute intracranial abnormalities.   Original Report Authenticated By: Ulyses Southward, M.D.     Assessment/Plan: Healthy 42 y/o with transient neurological  dysfunction characterized by headache, nausea, muscle spasms, increased sweatiness, slurred speech, and left face droop. Except for brisk knee jerk bilaterally, her neuro-exam is unimpressive and CT brain is unremarkable. No risk factors for stroke and thus  I am not sure she had a TIA. No typical presentation for seizure. No history of migraine and thus doubt a complicated migraine. First attack of a demyelinating CNS process? Presyncopal episode?.  Will suggest MRI with and without contrast/MRA brain and then make further decisions accordingly. Will follow up with you.    Wyatt Portela, MD [facility] [phone]  09/17/2012, 1:07 AM

## 2012-09-17 NOTE — Progress Notes (Signed)
Utilization Review Completed.Ricco Dershem T8/29/2014  

## 2012-09-17 NOTE — Progress Notes (Signed)
*  PRELIMINARY RESULTS* Vascular Ultrasound Carotid Duplex (Doppler) has been completed.  Preliminary findings: Bilateral:  1-39% ICA stenosis.  Vertebral artery flow is antegrade.      Farrel Demark, RDMS, RVT  09/17/2012, 3:04 PM

## 2012-10-05 ENCOUNTER — Ambulatory Visit (INDEPENDENT_AMBULATORY_CARE_PROVIDER_SITE_OTHER): Payer: BC Managed Care – PPO | Admitting: Neurology

## 2012-10-05 ENCOUNTER — Encounter: Payer: Self-pay | Admitting: Neurology

## 2012-10-05 VITALS — BP 118/79 | HR 71 | Temp 99.0°F | Ht 63.0 in | Wt 133.0 lb

## 2012-10-05 DIAGNOSIS — R0683 Snoring: Secondary | ICD-10-CM

## 2012-10-05 DIAGNOSIS — R251 Tremor, unspecified: Secondary | ICD-10-CM

## 2012-10-05 DIAGNOSIS — G459 Transient cerebral ischemic attack, unspecified: Secondary | ICD-10-CM

## 2012-10-05 DIAGNOSIS — R0609 Other forms of dyspnea: Secondary | ICD-10-CM

## 2012-10-05 DIAGNOSIS — R259 Unspecified abnormal involuntary movements: Secondary | ICD-10-CM

## 2012-10-05 DIAGNOSIS — R4781 Slurred speech: Secondary | ICD-10-CM

## 2012-10-05 DIAGNOSIS — R4789 Other speech disturbances: Secondary | ICD-10-CM

## 2012-10-05 DIAGNOSIS — G4710 Hypersomnia, unspecified: Secondary | ICD-10-CM

## 2012-10-05 DIAGNOSIS — G43109 Migraine with aura, not intractable, without status migrainosus: Secondary | ICD-10-CM

## 2012-10-05 DIAGNOSIS — R400 Somnolence: Secondary | ICD-10-CM

## 2012-10-05 NOTE — Patient Instructions (Addendum)
I think overall you are doing fairly well but I do want to suggest a few things today:  Remember to drink plenty of fluid, eat healthy meals and do not skip any meals. Try to eat protein with a every meal and eat a healthy snack such as fruit or nuts in between meals. Try to keep a regular sleep-wake schedule and try to exercise daily, particularly in the form of walking, 20-30 minutes a day, if you can.   Please remember, common headache triggers are: sleep deprivation, dehydration, overheating, stress, hypoglycemia or skipping meals and blood sugar fluctuations, excessive pain medications or excessive alcohol use or caffeine withdrawal. Some people have food triggers such as aged cheese, orange juice or chocolate, especially dark chocolate, or MSG (monosodium glutamate). Try to avoid these headache triggers as much possible. It may be helpful to keep a headache diary to figure out what makes your headaches worse or brings them on and what alleviates them. Some people report headache onset after exercise but studies have shown that regular exercise may actually prevent headaches from coming. If you have exercise-induced headaches, please make sure that you drink plenty of fluid before and after exercising and that you do not over do it and do not overheat.  As far as your medications are concerned, I would like to suggest no changes, continue baby aspirin.   As far as diagnostic testing: sleep study, EEG, blood work.  I would like to see you back in 3 months, sooner if we need to. Please call us with any interim questions, concerns, problems, updates or refill requests.  Please also call us for any test results so we can go over those with you on the phone. Brett Canales is my clinical assistant and will answer any of your questions and relay your messages to me and also relay most of my messages to you.  Our phone number is [phone]. We also have an after hours call service for urgent matters and there  is a physician on-call for urgent questions. For any emergencies you know to call 911 or go to the nearest emergency room.

## 2012-10-05 NOTE — Progress Notes (Signed)
Subjective:    Patient ID: Terri Garner is a 42 y.o. female.  HPI  Huston Foley, MD, PhD [facility] [address] [address] [place], [place] [address]  Dear [name],  I saw your patient, Terri Garner, upon your kind request in my neurologic clinic today for initial consultation of her recent TIA. The patient is unaccompanied today. As you know, [name] is a very pleasant 42 year old right-handed woman with an underlying medical history of irritable bowel syndrome, arthritis, elevated blood sugar values, who presented to the emergency room on 09/16/2012 with new onset occipital headache associated with nausea and she developed left upper extremity and left lower extremity clumsiness and weakness as well as left facial droop. This resolved after an several minutes. At the time of ER presentation she was nonfocal and had resolution of her weakness but had a headache. She was transferred to [facility] emergency room for further workup. CT head was negative. She had mild headache. She denied any chest pain or shortness of breath or palpitations or any prior similar episodes. She was placed on telemetry and had a TIA workup which included a CT head, MRI brain, MRA head, carotid Doppler, echocardiogram, all of which were unremarkable. She was noted to have mild hyperlipidemia and a borderline hemoglobin A1c. She was told that she could have had a TIA versus migraine with complications. She was placed on a baby aspirin, which she has been taking since then without problems. She does not endorse a significantly stressful day, when she presented.  She is symptom free and has had no further headaches. She has never had a migraine before or TIA-like Sx. There is no FHx of migraines. She reports snoring and has woken herself up from her snoring. She has a FHx of OSA in her father. She has never had a PSG. She endorses EDS and her ESS is 11/24 today. Never had diplopia or VF cut, or  sudden decrease in vision.  Her BT is around 9 PM, and her wake time is 5-6 AM. She has a difficult time waking up lately. She uses 2 alarms. She did have a all over trembling at the time; she has not had an EEG.  Her Past Medical History Is Significant For: Past Medical History  Diagnosis Date  . Arthritis     L hip    Her Past Surgical History Is Significant For: Past Surgical History  Procedure Laterality Date  . None      Her Family History Is Significant For: Family History  Problem Relation Age of Onset  . Diabetes Father   . Heart attack Father     Her Social History Is Significant For: History   Social History  . Marital Status: Divorced    Spouse Name: N/A    Number of Children: 0  . Years of Education: college   Occupational History  .  [employer]   Social History Main Topics  . Smoking status: Never Smoker   . Smokeless tobacco: None  . Alcohol Use: No  . Drug Use: No  . Sexual Activity: Yes    Partners: Male    Birth Control/ Protection: None   Other Topics Concern  . None   Social History Narrative  . None    Her Allergies Are:  No Known Allergies:   Her Current Medications Are:  Outpatient Encounter Prescriptions as of 10/05/2012  Medication Sig Dispense Refill  . aspirin EC 81 MG tablet Take  1 tablet (81 mg total) by mouth daily.  30 tablet  0   No facility-administered encounter medications on file as of 10/05/2012.  :  Review of Systems:  Out of a complete 14 point review of systems, all are reviewed and negative with the exception of these symptoms as listed below:  Review of Systems  Constitutional: Positive for fatigue.  Respiratory:       Snoring  Neurological: Positive for dizziness, tremors, weakness, numbness and headaches.       Slurred speech Snoring Restless legs  Psychiatric/Behavioral:       Decreased energy    Objective:  Neurologic Exam  Physical Exam Physical Examination:   Filed Vitals:    10/05/12 0827  BP: 118/79  Pulse: 71  Temp: 99 F (37.2 C)    General Examination: The patient is a very pleasant 42 y.o. female in no acute distress. She appears well-developed and well-nourished and well groomed.   HEENT: Normocephalic, atraumatic, pupils are equal, round and reactive to light and accommodation. Funduscopic exam is normal with sharp disc margins noted. Extraocular tracking is good without limitation to gaze excursion or nystagmus noted. Normal smooth pursuit is noted. Hearing is grossly intact. Tympanic membranes are clear bilaterally. Face is symmetric with normal facial animation and normal facial sensation. Speech is clear with no dysarthria noted. There is no hypophonia. There is no lip, neck/head, jaw or voice tremor. Neck is supple with full range of passive and active motion. There are no carotid bruits on auscultation. Oropharynx exam reveals: mild mouth dryness, adequate dental hygiene and mild airway crowding, due to narrow airway entry. Mallampati is class II. Tongue protrudes centrally and palate elevates symmetrically. Tonsils are 1+. Neck size is 12.5 inches.   Chest: Clear to auscultation without wheezing, rhonchi or crackles noted.  Heart: S1+S2+0, regular and normal without murmurs, rubs or gallops noted.   Abdomen: Soft, non-tender and non-distended with normal bowel sounds appreciated on auscultation.  Extremities: There is no pitting edema in the distal lower extremities bilaterally. Pedal pulses are intact.  Skin: Warm and dry without trophic changes noted. There are no varicose veins.  Musculoskeletal: exam reveals no obvious joint deformities, tenderness or joint swelling or erythema.   Neurologically:  Mental status: The patient is awake, alert and oriented in all 4 spheres. Her memory, attention, language and knowledge are appropriate. There is no aphasia, agnosia, apraxia or anomia. Speech is clear with normal prosody and enunciation. Thought  process is linear. Mood is congruent and affect is normal.  Cranial nerves are as described above under HEENT exam. In addition, shoulder shrug is normal with equal shoulder height noted. Motor exam: Normal bulk, strength and tone is noted. There is no drift, tremor or rebound. Romberg is negative. Reflexes are 2+ throughout. Toes are downgoing bilaterally. Fine motor skills are intact with normal finger taps, normal hand movements, normal rapid alternating patting, normal foot taps and normal foot agility.  Cerebellar testing shows no dysmetria or intention tremor on finger to nose testing. Heel to shin is unremarkable bilaterally. There is no truncal or gait ataxia.  Sensory exam is intact to light touch, pinprick, vibration, temperature sense and proprioception in the upper and lower extremities.  Gait, station and balance are unremarkable. No veering to one side is noted. No leaning to one side is noted. Posture is age-appropriate and stance is narrow based. No problems turning are noted. She turns en bloc. Tandem walk is unremarkable. Intact toe and heel stance is  noted.               Assessment and Plan:   In summary, Isa Hitz is a very pleasant 42 y.o.-year old female with a benign medical Hx and recent episode of facial weakness on the L and new onset L occipital headache with speech impairment, all resolved after minutes, the headache lasted longer. Her physical exam is stable and non-focal. She is doing fairly well at this time and I reassured the patient in that regard. DDx includes complicated migraine, new onset. I feel, she has less likely had a TIA, and is even less likely to have had a Sz. She is advised, that if this was a migraine, it can come back. I had a long chat with the patient and about my findings and the diagnosis of migraine vs TIA vs Sz and the Dx of OSA, the prognosis and treatment options. We talked about medical treatments and non-pharmacological approaches. I think she  has few vascular RF, but given her FHx of young onset stroke in one cousin in his early 10s, we should complete w/u with hypercoagulable profile. In addition, given her Hx of snoring and EDS, we should do a sleep study. We talked about maintaining a healthy lifestyle in general. I encouraged the patient to eat healthy, exercise daily and keep well hydrated, to keep a scheduled bedtime and wake time routine, to not skip any meals and eat healthy snacks in between meals and to have protein with every meal.   I advised the patient about common headache triggers: sleep deprivation, dehydration, overheating, stress, hypoglycemia or skipping meals and blood sugar fluctuations, excessive pain medications or excessive alcohol use or caffeine withdrawal. Some people have food triggers such as aged cheese, orange juice or chocolate, especially dark chocolate, or MSG (monosodium glutamate). She is to try to avoid these headache triggers as much possible. It may be helpful to keep a headache diary to figure out what makes Her headaches worse or brings them on and what alleviates them. Some people report headache onset after exercise but studies have shown that regular exercise may actually prevent headaches from coming. If She has exercise-induced headaches, She is advised to drink plenty of fluid before and after exercising and that to not overdo it and to not overheat.  As far as further diagnostic testing is concerned, I suggested the following today: EEG, sleep study, blood work.  As far as medications are concerned, I recommended the following at this time: no change. Continue baby ASA. I answered all her questions today and the patient was in agreement with the above outlined plan. I would like to see the patient back in 3 months, sooner if the need arises and encouraged her to call with any interim questions, concerns, problems or updates and test results.   Thank you very much for allowing me to participate in  the care of this nice patient. If I can be of any further assistance to you please do not hesitate to call me at [phone].  Sincerely,   Huston Foley, MD, PhD

## 2012-10-06 ENCOUNTER — Telehealth: Payer: Self-pay | Admitting: Neurology

## 2012-10-06 NOTE — Telephone Encounter (Signed)
I called and left a message for the patient to callback to the office to schedule her sleep study.

## 2012-10-12 ENCOUNTER — Ambulatory Visit (INDEPENDENT_AMBULATORY_CARE_PROVIDER_SITE_OTHER): Payer: BC Managed Care – PPO

## 2012-10-12 DIAGNOSIS — R4789 Other speech disturbances: Secondary | ICD-10-CM

## 2012-10-12 DIAGNOSIS — G43109 Migraine with aura, not intractable, without status migrainosus: Secondary | ICD-10-CM

## 2012-10-12 DIAGNOSIS — R259 Unspecified abnormal involuntary movements: Secondary | ICD-10-CM

## 2012-10-12 DIAGNOSIS — R251 Tremor, unspecified: Secondary | ICD-10-CM

## 2012-10-12 DIAGNOSIS — G459 Transient cerebral ischemic attack, unspecified: Secondary | ICD-10-CM

## 2012-10-12 DIAGNOSIS — R4781 Slurred speech: Secondary | ICD-10-CM

## 2012-10-12 NOTE — Procedures (Signed)
  History:   Terri Garner is a 42 year old patient with a history of a headache in the occipital area on 09/16/2012, associated with nausea, left upper extremity weakness and left facial droop. The stroke workup was unremarkable, and the patient is being evaluated for this event.  This is a routine EEG. No skull defects are noted. Medications include aspirin.  EEG classification: Normal awake and drowsy  Description of the recording: The background rhythms of this recording consists of a fairly well modulated medium amplitude alpha rhythm of 9 Hz that is reactive to eye opening and closure. As the record progresses, the patient appears to remain in the waking state throughout the recording. Photic stimulation was performed, resulting in a bilateral and symmetric photic driving response. Hyperventilation was also performed, resulting in a minimal buildup of the background rhythm activities without significant slowing seen. Toward the end of the recording, the patient enters the drowsy state with slight symmetric slowing seen. The patient never enters stage II sleep. At no time during the recording does there appear to be evidence of spike or spike wave discharges or evidence of focal slowing. EKG monitor shows no evidence of cardiac rhythm abnormalities with a heart rate of 60.  Impression: This is a normal EEG recording in the waking and drowsy state. No evidence of ictal or interictal discharges are seen.

## 2012-10-13 ENCOUNTER — Telehealth: Payer: Self-pay | Admitting: Neurology

## 2012-10-13 NOTE — Progress Notes (Signed)
Quick Note:  Please call and advise the patient that the EEG or brain wave test we performed was reported as normal in the awake and drowsy states. We checked for abnormal electrical discharges in the brain waves and the report suggested normal findings. No further action is required on this test at this time. Please remind patient to keep any upcoming appointments or tests and to call us with any interim questions, concerns, problems or updates. Thanks,  Kaven Cumbie, MD, PhD    ______ 

## 2012-10-14 ENCOUNTER — Telehealth: Payer: Self-pay

## 2012-10-14 NOTE — Telephone Encounter (Signed)
Message copied by [facility] on Thu Oct 14, 2012  2:48 PM ------      Message from: Huston Foley      Created: Wed Oct 13, 2012  3:20 PM       Please call and advise the patient that the EEG or brain wave test we performed was reported as normal in the awake and drowsy states. We checked for abnormal electrical discharges in the brain waves and the report suggested normal findings. No further action is required on this test at this time. Please remind patient to keep any upcoming appointments or tests and to call us with any interim questions, concerns, problems or updates. Thanks,       Huston Foley, MD, PhD                   ------

## 2012-10-14 NOTE — Telephone Encounter (Signed)
I called cell number. No answer. I called home number and was asked to call the cell number back. I called the cell number again and left a VM that the recent testing showed normal findings. Should you have further questions, please call [phone]. Also, remember to keep all future scheduled appointments and call if increased symptoms.

## 2012-10-15 ENCOUNTER — Other Ambulatory Visit: Payer: BC Managed Care – PPO

## 2012-10-16 ENCOUNTER — Ambulatory Visit (INDEPENDENT_AMBULATORY_CARE_PROVIDER_SITE_OTHER): Payer: BC Managed Care – PPO | Admitting: Neurology

## 2012-10-16 DIAGNOSIS — G479 Sleep disorder, unspecified: Secondary | ICD-10-CM

## 2012-10-16 DIAGNOSIS — R0683 Snoring: Secondary | ICD-10-CM

## 2012-10-16 DIAGNOSIS — G459 Transient cerebral ischemic attack, unspecified: Secondary | ICD-10-CM

## 2012-10-16 DIAGNOSIS — R400 Somnolence: Secondary | ICD-10-CM

## 2012-10-16 DIAGNOSIS — G4761 Periodic limb movement disorder: Secondary | ICD-10-CM

## 2012-10-20 LAB — C-REACTIVE PROTEIN: CRP: 0.4 mg/L (ref 0.0–4.9)

## 2012-10-20 LAB — HYPERCOAGULABLE PANEL, COMPREHENSIVE
APTT: 30.8 s
AT III Act/Nor PPP Chro: 100 %
Act. Prt C Resist w/FV Defic.: 2.2 ratio
Anticardiolipin Ab, IgG: <10 [GPL'U]
Beta-2 Glycoprotein I, IgA: <10 SAU
Beta-2 Glycoprotein I, IgG: <10 SGU
Beta-2 Glycoprotein I, IgM: <10 SMU
DRVVT Screen Seconds: 30 s
Factor VIII Activity: 81 %
Protein C Ag/FVII Ag Ratio**: 1 ratio

## 2012-10-20 LAB — SEDIMENTATION RATE: Sed Rate: 2 mm/hr (ref 0–32)

## 2012-10-20 LAB — RHEUMATOID FACTOR: Rhuematoid fact SerPl-aCnc: 8.6 IU/mL (ref 0.0–13.9)

## 2012-10-20 LAB — ANA W/REFLEX: Anti Nuclear Antibody(ANA): NEGATIVE

## 2012-10-20 LAB — TSH: TSH: 1.95 u[IU]/mL (ref 0.450–4.500)

## 2012-10-20 LAB — RPR: RPR: NONREACTIVE

## 2012-10-27 ENCOUNTER — Telehealth: Payer: Self-pay | Admitting: Neurology

## 2012-10-27 NOTE — Telephone Encounter (Signed)
Please call and notify the patient that the recent sleep study did not show any significant obstructive sleep apnea. Please inform patient that I would like to go over the details of the study during a follow up appointment. I would like to see her back in 2 to 3 months. Please arrange a followup appointment (please utilize a followu-up slot). Also, route or fax report to PCP and referring MD, if other than PCP.  Once you have spoken to patient, you can close this encounter.   Thanks,  Huston Foley, MD, PhD [facility])

## 2012-10-29 ENCOUNTER — Encounter: Payer: Self-pay | Admitting: *Deleted

## 2012-10-29 NOTE — Telephone Encounter (Signed)
I called and left a message for the patient concerning her recent sleep study. I informed the patient that there was no significant obstructive sleep apnea and that Dr. Frances Furbish wants her to f/u with her in 2-3 months. I informed the patient to callback to the office to schedule her f/u with Dr. Frances Furbish.

## 2012-11-04 ENCOUNTER — Telehealth: Payer: Self-pay | Admitting: Neurology

## 2012-11-04 NOTE — Telephone Encounter (Signed)
I called and left a message for the patient to callback on tomorrow to speak with me Terri Garner.

## 2012-11-05 ENCOUNTER — Telehealth: Payer: Self-pay | Admitting: Neurology

## 2012-11-05 NOTE — Telephone Encounter (Signed)
I called and left a message for  the patient to callback to the office to schedule her f/u with Dr. Frances Furbish.

## 2012-11-25 ENCOUNTER — Other Ambulatory Visit: Payer: Self-pay

## 2013-03-25 ENCOUNTER — Other Ambulatory Visit: Payer: Self-pay | Admitting: Family Medicine

## 2013-03-25 DIAGNOSIS — Z1231 Encounter for screening mammogram for malignant neoplasm of breast: Secondary | ICD-10-CM

## 2013-04-08 ENCOUNTER — Ambulatory Visit
Admission: RE | Admit: 2013-04-08 | Discharge: 2013-04-08 | Disposition: A | Discharge status: Home / Self Care | Payer: BC Managed Care – PPO | Source: Ambulatory Visit | Attending: Family Medicine | Admitting: Family Medicine

## 2013-04-08 DIAGNOSIS — Z1231 Encounter for screening mammogram for malignant neoplasm of breast: Secondary | ICD-10-CM

## 2013-06-12 ENCOUNTER — Encounter (HOSPITAL_COMMUNITY): Payer: Self-pay | Admitting: Emergency Medicine

## 2013-06-12 ENCOUNTER — Emergency Department (HOSPITAL_COMMUNITY)
Admission: EM | Admit: 2013-06-12 | Discharge: 2013-06-12 | Disposition: A | Discharge status: Home / Self Care | Payer: BC Managed Care – PPO | Source: Home / Self Care

## 2013-06-12 DIAGNOSIS — H9209 Otalgia, unspecified ear: Secondary | ICD-10-CM

## 2013-06-12 DIAGNOSIS — H9203 Otalgia, bilateral: Secondary | ICD-10-CM

## 2013-06-12 DIAGNOSIS — J020 Streptococcal pharyngitis: Secondary | ICD-10-CM

## 2013-06-12 DIAGNOSIS — H6980 Other specified disorders of Eustachian tube, unspecified ear: Secondary | ICD-10-CM

## 2013-06-12 LAB — POCT RAPID STREP A: STREPTOCOCCUS, GROUP A SCREEN (DIRECT): POSITIVE — AB

## 2013-06-12 MED ORDER — AMOXICILLIN 500 MG PO CAPS: 500.0000 mg | ORAL_CAPSULE | Freq: Three times a day (TID) | ORAL | Status: DC

## 2013-06-12 NOTE — ED Provider Notes (Signed)
CSN: [id number]     Arrival date & time 06/12/13  1751 History   First MD Initiated Contact with Patient 06/12/13 1831     Chief Complaint  Patient presents with  . Sore Throat   (Consider location/radiation/quality/duration/timing/severity/associated sxs/prior Treatment) HPI Comments: Sore throat for 48 h . Bilat earaches.   Patient is a 43 y.o. female presenting with pharyngitis.  Sore Throat Pertinent negatives include no chest pain and no shortness of breath.    Past Medical History  Diagnosis Date  . Arthritis     L hip   Past Surgical History  Procedure Laterality Date  . None     Family History  Problem Relation Age of Onset  . Diabetes Father   . Heart attack Father    History  Substance Use Topics  . Smoking status: Never Smoker   . Smokeless tobacco: Not on file  . Alcohol Use: No   OB History   Grav Para Term Preterm Abortions TAB SAB Ect Mult Living                 Review of Systems  Constitutional: Negative for fever and activity change.  HENT: Positive for congestion, postnasal drip and sore throat.   Respiratory: Negative for cough, shortness of breath and wheezing.   Cardiovascular: Negative for chest pain.  Gastrointestinal: Negative.     Allergies  Review of patient's allergies indicates no known allergies.  Home Medications   Prior to Admission medications   Medication Sig Start Date End Date Taking? Authorizing Provider  aspirin EC 81 MG tablet Take 1 tablet (81 mg total) by mouth daily. 09/17/12   Nishant Dhungel, MD   BP 128/87  Pulse 86  Temp(Src) 98.2 F (36.8 C) (Oral)  Resp 20  SpO2 95% Physical Exam  Nursing note and vitals reviewed. Constitutional: She is oriented to person, place, and time. She appears well-developed and well-nourished. No distress.  HENT:  Mouth/Throat: No oropharyngeal exudate.  Bilat TM's retracted. No effusions or erythema. OP with erythema and PND  Eyes: Conjunctivae and EOM are normal.  Neck:  Normal range of motion. Neck supple.  Cardiovascular: Normal rate, regular rhythm and normal heart sounds.   Pulmonary/Chest: Effort normal and breath sounds normal. She has no wheezes. She has no rales.  Lymphadenopathy:    She has cervical adenopathy.  Neurological: She is alert and oriented to person, place, and time. She exhibits normal muscle tone.  Skin: Skin is warm and dry.  Psychiatric: She has a normal mood and affect.    ED Course  Procedures (including critical care time) Labs Review Labs Reviewed  POCT RAPID STREP A ([facility] URG CARE ONLY) - Abnormal; Notable for the following:    Streptococcus, Group A Screen (Direct) POSITIVE (*)    All other components within normal limits    Imaging Review No results found.   MDM   1. Strep pharyngitis   2. ETD (eustachian tube dysfunction)   3. Otalgia of both ears    Amoxicillin Fluids Sudafed and allegra. Ibuprofen    Hayden Rasmussen, NP 06/12/13 1914

## 2013-06-12 NOTE — Discharge Instructions (Signed)
Otalgia Sudafed PE and allegra or claritin Plenty of fluids Otalgia is pain in or around the ear. When the pain is from the ear itself it is called primary otalgia. Pain may also be coming from somewhere else, like the head and neck. This is called secondary otalgia.  CAUSES  Causes of primary otalgia include:  Middle ear infection.  It can also be caused by injury to the ear or infection of the ear canal (swimmer's ear). Swimmer's ear causes pain, swelling and often drainage from the ear canal. Causes of secondary otalgia include:  Sinus infections.  Allergies and colds that cause stuffiness of the nose and tubes that drain the ears (eustachian tubes).  Dental problems like cavities, gum infections or teething.  Sore Throat (tonsillitis and pharyngitis).  Swollen glands in the neck.  Infection of the bone behind the ear (mastoiditis).  TMJ discomfort (problems with the joint between your jaw and your skull).  Other problems such as nerve disorders, circulation problems, heart disease and tumors of the head and neck can also cause symptoms of ear pain. This is rare. DIAGNOSIS  Evaluation, Diagnosis and Testing:  Examination by your medical caregiver is recommended to evaluate and diagnose the cause of otalgia.  Further testing or referral to a specialist may be indicated if the cause of the ear pain is not found and the symptom persists. TREATMENT   Your doctor may prescribe antibiotics if an ear infection is diagnosed.  Pain relievers and topical analgesics may be recommended.  It is important to take all medications as prescribed. HOME CARE INSTRUCTIONS   It may be helpful to sleep with the painful ear in the up position.  A warm compress over the painful ear may provide relief.  A soft diet and avoiding gum may help while ear pain is present. SEEK IMMEDIATE MEDICAL CARE IF:  You develop severe pain, a high fever, repeated vomiting or dehydration.  You develop  extreme dizziness, headache, confusion, ringing in the ears (tinnitus) or hearing loss. Document Released: 02/14/2004 Document Revised: 03/31/2011 Document Reviewed: 11/15/2008 [facility] Patient Information 2014 [place], [place].  Strep Throat Strep throat is an infection of the throat caused by a bacteria named Streptococcus pyogenes. Your caregiver may call the infection streptococcal "tonsillitis" or "pharyngitis" depending on whether there are signs of inflammation in the tonsils or back of the throat. Strep throat is most common in children aged 5 15 years during the cold months of the year, but it can occur in people of any age during any season. This infection is spread from person to person (contagious) through coughing, sneezing, or other close contact. SYMPTOMS   Fever or chills.  Painful, swollen, red tonsils or throat.  Pain or difficulty when swallowing.  White or yellow spots on the tonsils or throat.  Swollen, tender lymph nodes or "glands" of the neck or under the jaw.  Red rash all over the body (rare). DIAGNOSIS  Many different infections can cause the same symptoms. A test must be done to confirm the diagnosis so the right treatment can be given. A "rapid strep test" can help your caregiver make the diagnosis in a few minutes. If this test is not available, a light swab of the infected area can be used for a throat culture test. If a throat culture test is done, results are usually available in a day or two. TREATMENT  Strep throat is treated with antibiotic medicine. HOME CARE INSTRUCTIONS   Gargle with 1 tsp of salt  in 1 cup of warm water, 3 4 times per day or as needed for comfort.  Family members who also have a sore throat or fever should be tested for strep throat and treated with antibiotics if they have the strep infection.  Make sure everyone in your household washes their hands well.  Do not share food, drinking cups, or personal items that could cause the  infection to spread to others.  You may need to eat a soft food diet until your sore throat gets better.  Drink enough water and fluids to keep your urine clear or pale yellow. This will help prevent dehydration.  Get plenty of rest.  Stay home from school, daycare, or work until you have been on antibiotics for 24 hours.  Only take over-the-counter or prescription medicines for pain, discomfort, or fever as directed by your caregiver.  If antibiotics are prescribed, take them as directed. Finish them even if you start to feel better. SEEK MEDICAL CARE IF:   The glands in your neck continue to enlarge.  You develop a rash, cough, or earache.  You cough up green, yellow-brown, or bloody sputum.  You have pain or discomfort not controlled by medicines.  Your problems seem to be getting worse rather than better. SEEK IMMEDIATE MEDICAL CARE IF:   You develop any new symptoms such as vomiting, severe headache, stiff or painful neck, chest pain, shortness of breath, or trouble swallowing.  You develop severe throat pain, drooling, or changes in your voice.  You develop swelling of the neck, or the skin on the neck becomes red and tender.  You have a fever.  You develop signs of dehydration, such as fatigue, dry mouth, and decreased urination.  You become increasingly sleepy, or you cannot wake up completely. Document Released: 01/04/2000 Document Revised: 12/24/2011 Document Reviewed: 03/07/2010 [facility] Patient Information 2014 [place], [place].

## 2013-06-12 NOTE — ED Notes (Signed)
Patient complains of sore throat and bilateral ear pain the past three days Denies any fever

## 2013-06-12 NOTE — ED Provider Notes (Signed)
Medical screening examination/treatment/procedure(s) were performed by non-physician practitioner and as supervising physician I was immediately available for consultation/collaboration.  Leslee Home, M.D.  Reuben Likes, MD 06/12/13 2236

## 2013-06-14 ENCOUNTER — Emergency Department (INDEPENDENT_AMBULATORY_CARE_PROVIDER_SITE_OTHER)
Admission: EM | Admit: 2013-06-14 | Discharge: 2013-06-14 | Disposition: A | Discharge status: Home / Self Care | Payer: BC Managed Care – PPO | Source: Home / Self Care | Attending: Family Medicine | Admitting: Family Medicine

## 2013-06-14 ENCOUNTER — Encounter (HOSPITAL_COMMUNITY): Payer: Self-pay | Admitting: Emergency Medicine

## 2013-06-14 DIAGNOSIS — T7840XA Allergy, unspecified, initial encounter: Secondary | ICD-10-CM

## 2013-06-14 DIAGNOSIS — I10 Essential (primary) hypertension: Secondary | ICD-10-CM

## 2013-06-14 DIAGNOSIS — J020 Streptococcal pharyngitis: Secondary | ICD-10-CM

## 2013-06-14 DIAGNOSIS — T50995A Adverse effect of other drugs, medicaments and biological substances, initial encounter: Secondary | ICD-10-CM

## 2013-06-14 MED ORDER — METHYLPREDNISOLONE 4 MG PO KIT: PACK | ORAL | Status: DC

## 2013-06-14 MED ORDER — CLINDAMYCIN HCL 300 MG PO CAPS
300.0000 mg | ORAL_CAPSULE | Freq: Three times a day (TID) | ORAL | Status: DC
Start: 2013-06-14 — End: 2013-09-13

## 2013-06-14 NOTE — ED Provider Notes (Signed)
CSN: [id number]     Arrival date & time 06/14/13  1113 History   First MD Initiated Contact with Patient 06/14/13 1230     Chief Complaint  Patient presents with  . Follow-up  . Allergic Reaction   (Consider location/radiation/quality/duration/timing/severity/associated sxs/prior Treatment) HPI Comments: 43 year old female presents complaining of sore throat and possible allergic reaction to amoxicillin. She was seen 2 days ago and was diagnosed with strep throat. She was given a prescription for amoxicillin. After her second dose, she started to have an extremely itchy red raised rash on her trunk and extremities. This started to go away, but returned after she took another dose of amoxicillin. It doesn't seem to be going away this time. She does not want to take anymore amoxicillin. She is still having the sore throat. It is not getting any better yet. She denies any diarrhea, dizziness, or swelling of lips or tongue. No history of severe allergic reactions.   Past Medical History  Diagnosis Date  . Arthritis     L hip   Past Surgical History  Procedure Laterality Date  . None     Family History  Problem Relation Age of Onset  . Diabetes Father   . Heart attack Father    History  Substance Use Topics  . Smoking status: Never Smoker   . Smokeless tobacco: Not on file  . Alcohol Use: No   OB History   Grav Para Term Preterm Abortions TAB SAB Ect Mult Living                 Review of Systems  HENT: Positive for sore throat.   Skin: Positive for rash.  All other systems reviewed and are negative.   Allergies  Amoxicillin  Home Medications   Prior to Admission medications   Medication Sig Start Date End Date Taking? Authorizing Provider  amoxicillin (AMOXIL) 500 MG capsule Take 1 capsule (500 mg total) by mouth 3 (three) times daily. 06/12/13  Yes Hayden Rasmussenavid Mabe, NP  aspirin EC 81 MG tablet Take 1 tablet (81 mg total) by mouth daily. 09/17/12  Yes Nishant Dhungel, MD   clindamycin (CLEOCIN) 300 MG capsule Take 1 capsule (300 mg total) by mouth 3 (three) times daily. 06/14/13   Graylon GoodZachary H Cameryn Chrisley, PA-C  methylPREDNISolone (MEDROL DOSEPAK) 4 MG tablet Use as directed on package instructions 06/14/13   Graylon GoodZachary H Salmaan Patchin, PA-C   BP 138/74  Pulse 73  Temp(Src) 98.3 F (36.8 C) (Oral)  Resp 12  SpO2 100%  LMP 06/14/2013 Physical Exam  Nursing note and vitals reviewed. Constitutional: She is oriented to person, place, and time. Vital signs are normal. She appears well-developed and well-nourished. No distress.  HENT:  Head: Normocephalic and atraumatic.  Mouth/Throat: Uvula is midline and mucous membranes are normal. Oropharyngeal exudate and posterior oropharyngeal erythema present.  Pulmonary/Chest: Effort normal. No respiratory distress.  Neurological: She is alert and oriented to person, place, and time. She has normal strength. Coordination normal.  Skin: Skin is warm and dry. Rash noted. Rash is urticarial (raised urticarial rash on the trunk and bilateral upper extremities, symmetric). She is not diaphoretic.  Psychiatric: She has a normal mood and affect. Judgment normal.    ED Course  Procedures (including critical care time) Labs Review Labs Reviewed - No data to display  Imaging Review No results found.   MDM   1. Strep pharyngitis   2. Allergic drug reaction    Stop amoxicillin, start clindamycin, also Medrol Dosepak. She  will take a daily allergy medicine as well until this resolves. ED return precautions given. Followup as needed.    Meds ordered this encounter  Medications  . clindamycin (CLEOCIN) 300 MG capsule    Sig: Take 1 capsule (300 mg total) by mouth 3 (three) times daily.    Dispense:  21 capsule    Refill:  0    Order Specific Question:  Supervising Provider    Answer:  Linna Hoff [phone]  . methylPREDNISolone (MEDROL DOSEPAK) 4 MG tablet    Sig: Use as directed on package instructions    Dispense:  21 tablet     Refill:  0    Order Specific Question:  Supervising Provider    Answer:  Bradd Canary D [[id number]]     Graylon Good, PA-C 06/14/13 2137

## 2013-06-14 NOTE — ED Notes (Signed)
Pt here for follow up.  Was seen on 5/24 and prescribed amoxicillin.   States after taking two does of medication broke out in rash on chest. Stomach.  And face.  Pt has stopped medication.   Still having throat and ear pain.

## 2013-06-14 NOTE — Discharge Instructions (Signed)

## 2013-06-17 NOTE — ED Provider Notes (Signed)
Medical screening examination/treatment/procedure(s) were performed by resident physician or non-physician practitioner and as supervising physician I was immediately available for consultation/collaboration.   Elizabethanne Lusher DOUGLAS MD.   Verbena Boeding D Randi Poullard, MD 06/17/13 1004 

## 2013-09-13 ENCOUNTER — Emergency Department (HOSPITAL_COMMUNITY)
Admission: EM | Admit: 2013-09-13 | Discharge: 2013-09-13 | Disposition: A | Discharge status: Home / Self Care | Payer: BC Managed Care – PPO | Attending: Emergency Medicine | Admitting: Emergency Medicine

## 2013-09-13 ENCOUNTER — Encounter (HOSPITAL_COMMUNITY): Payer: Self-pay | Admitting: Emergency Medicine

## 2013-09-13 DIAGNOSIS — Z8673 Personal history of transient ischemic attack (TIA), and cerebral infarction without residual deficits: Secondary | ICD-10-CM | POA: Diagnosis not present

## 2013-09-13 DIAGNOSIS — Z3202 Encounter for pregnancy test, result negative: Secondary | ICD-10-CM | POA: Insufficient documentation

## 2013-09-13 DIAGNOSIS — R61 Generalized hyperhidrosis: Secondary | ICD-10-CM | POA: Diagnosis not present

## 2013-09-13 DIAGNOSIS — R42 Dizziness and giddiness: Secondary | ICD-10-CM | POA: Diagnosis not present

## 2013-09-13 DIAGNOSIS — Z792 Long term (current) use of antibiotics: Secondary | ICD-10-CM | POA: Insufficient documentation

## 2013-09-13 DIAGNOSIS — R110 Nausea: Secondary | ICD-10-CM | POA: Insufficient documentation

## 2013-09-13 DIAGNOSIS — Z880 Allergy status to penicillin: Secondary | ICD-10-CM | POA: Insufficient documentation

## 2013-09-13 DIAGNOSIS — M129 Arthropathy, unspecified: Secondary | ICD-10-CM | POA: Diagnosis not present

## 2013-09-13 DIAGNOSIS — Z7982 Long term (current) use of aspirin: Secondary | ICD-10-CM | POA: Insufficient documentation

## 2013-09-13 DIAGNOSIS — Z79899 Other long term (current) drug therapy: Secondary | ICD-10-CM | POA: Insufficient documentation

## 2013-09-13 HISTORY — DX: Transient cerebral ischemic attack, unspecified: G45.9

## 2013-09-13 LAB — URINALYSIS, ROUTINE W REFLEX MICROSCOPIC
Bilirubin Urine: NEGATIVE
Glucose, UA: NEGATIVE mg/dL
Ketones, ur: NEGATIVE mg/dL
LEUKOCYTES UA: NEGATIVE
NITRITE: NEGATIVE
Protein, ur: NEGATIVE mg/dL
SPECIFIC GRAVITY, URINE: 1.02 (ref 1.005–1.030)
UROBILINOGEN UA: 0.2 mg/dL (ref 0.0–1.0)
pH: 6 (ref 5.0–8.0)

## 2013-09-13 LAB — URINE MICROSCOPIC-ADD ON

## 2013-09-13 LAB — POC URINE PREG, ED: PREG TEST UR: NEGATIVE

## 2013-09-13 NOTE — Discharge Instructions (Signed)
Dizziness   Dizziness means you feel unsteady or lightheaded. You might feel like you are going to pass out (faint).  HOME CARE   · Drink enough fluids to keep your pee (urine) clear or pale yellow.  · Take your medicines exactly as told by your doctor. If you take blood pressure medicine, always stand up slowly from the lying or sitting position. Hold on to something to steady yourself.  · If you need to stand in one place for a long time, move your legs often. Tighten and relax your leg muscles.  · Have someone stay with you until you feel okay.  · Do not drive or use heavy machinery if you feel dizzy.  · Do not drink alcohol.  GET HELP RIGHT AWAY IF:   · You feel dizzy or lightheaded and it gets worse.  · You feel sick to your stomach (nauseous), or you throw up (vomit).  · You have trouble talking or walking.  · You feel weak or have trouble using your arms, hands, or legs.  · You cannot think clearly or have trouble forming sentences.  · You have chest pain, belly (abdominal) pain, sweating, or you are short of breath.  · Your vision changes.  · You are bleeding.  · You have problems from your medicine that seem to be getting worse.  MAKE SURE YOU:   · Understand these instructions.  · Will watch your condition.  · Will get help right away if you are not doing well or get worse.  Document Released: 12/26/2010 Document Revised: 03/31/2011 Document Reviewed: 12/26/2010  [employer]® Patient Information ©2015 [employer]. This information is not intended to replace advice given to you by your health care provider. Make sure you discuss any questions you have with your health care provider.

## 2013-09-13 NOTE — ED Provider Notes (Signed)
CSN: [id number]     Arrival date & time 09/13/13  1315 History   First MD Initiated Contact with Patient 09/13/13 1320     Chief Complaint  Patient presents with  . Extremity Weakness  . Nausea     (Consider location/radiation/quality/duration/timing/severity/associated sxs/prior Treatment) HPI This 43 year old female presents with an episode of lightheadedness associated with nausea and diaphoresis. 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 The onset was approximately 5 minutes prior to the call EMS. She was in her classroom and quickly became nauseated, sweaty and lightheaded. The patient left the room and went to the bathroom to wait for symptoms the pass. There was no associated nausea vomiting or diarrhea also no associated pain. She was then able to walk to the nurses station where she was evaluated, she reports that her blood pressure was documented as being elevated but she cannot recall the exact numbers. The patient had an episode approximately a year ago at which in  point time she experienced headache with multiple associated neurologic symptoms and dysfunction. Subsequent to that the patient underwent full neurologic workup including MRIs, EEGs and sleep studies. The patient's symptoms began to alleviate after she was en route to the emergency department. At this point time she still reports feeling just a little shaky and lightheaded. The patient has not been suffering from problems such as headaches fevers nausea vomiting gait incoordination or other evidence of acute or recent illness.  Past Medical History  Diagnosis Date  . Arthritis     L hip  . TIA (transient ischemic attack)    Past Surgical History  Procedure Laterality Date  . None     Family History  Problem Relation Age of Onset  . Diabetes Father   . Heart attack Father    History  Substance Use Topics  . Smoking status: Never Smoker   .  Smokeless tobacco: Not on file  . Alcohol Use: No   OB History   Grav Para Term Preterm Abortions TAB SAB Ect Mult Living                 Review of Systems  Constitutional: Negative for fever and chills.  HENT: Negative for congestion and sore throat.   Respiratory: Negative for cough and shortness of breath.   Cardiovascular: Negative for chest pain and leg swelling.  Gastrointestinal: Negative for vomiting, abdominal pain and diarrhea.  Genitourinary: Negative for dysuria and difficulty urinating.  Musculoskeletal: Negative for arthralgias and myalgias.  Skin: Negative for color change and rash.  Neurological: Negative for dizziness, weakness and headaches.  Psychiatric/Behavioral: Negative for confusion and agitation.   GU: The patient did just recently started her menstrual cycle today. She reports normal she has heavy menstrual cycles. Patient denies sexual activity for several months and denies possibility of pregnancy.   Allergies  Amoxicillin  Home Medications   Prior to Admission medications   Medication Sig Start Date End Date Taking? Authorizing Provider  amoxicillin (AMOXIL) 500 MG capsule Take 1 capsule (500 mg total) by mouth 3 (three) times daily. 06/12/13   Hayden Rasmussen, NP  aspirin EC 81 MG tablet Take 1 tablet (81 mg total) by mouth daily. 09/17/12   Nishant Dhungel, MD  clindamycin (CLEOCIN) 300 MG capsule Take 1 capsule (300 mg total) by mouth 3 (three) times daily. 06/14/13   Graylon Good, PA-C  methylPREDNISolone (MEDROL DOSEPAK) 4 MG tablet Use as directed on package  instructions 06/14/13   Graylon Good, PA-C   BP 123/72  Pulse 77  Temp(Src) 98.2 F (36.8 C) (Oral)  Resp 17  SpO2 100% Physical Exam  Constitutional: She is oriented to person, place, and time. She appears well-developed and well-nourished.  HENT:  Head: Normocephalic and atraumatic.  Eyes: EOM are normal. Pupils are equal, round, and reactive to light.  Neck: Neck supple.   Cardiovascular: Normal rate, regular rhythm, normal heart sounds and intact distal pulses.   Pulmonary/Chest: Effort normal and breath sounds normal.  Abdominal: Soft. Bowel sounds are normal. She exhibits no distension. There is no tenderness.  Musculoskeletal: Normal range of motion. She exhibits no edema.  Neurological: She is alert and oriented to person, place, and time. She has normal strength. She displays no atrophy and no tremor. No cranial nerve deficit or sensory deficit. She exhibits normal muscle tone. She displays no seizure activity. Coordination normal. GCS eye subscore is 4. GCS verbal subscore is 5. GCS motor subscore is 6.  Skin: Skin is warm, dry and intact.  Psychiatric: She has a normal mood and affect.   expanded neurologic exam; pupils are equally round and reactive to light with normal consensual response extraocular motions are intact, there are no visual field deficits. Patient has normal finger-nose coordination bilaterally with 5 out of 5 upper extremity strength. Patient has normal heel shin examination bilaterally on the lower extremities with 5 out of 5 lower extremity strength testing. Patient is cognitively intact and normal with clear speech and normal recall.   ED Course  Procedures (including critical care time) Labs Review Labs Reviewed  URINALYSIS, ROUTINE W REFLEX MICROSCOPIC  POC URINE PREG, ED    Imaging Review No results found.   EKG Interpretation None      MDM  This 43 yo female presents with symptoms that are atypical for TIA or acute neurologic dysfunction. There was concern due to a  presentation the patient had a year ago at which time there were more pronounced neurologatic findings. She and a extensive workup that was negative for any acute neurologic event. Today's episode involved onset of nausea lightheadedness and diaphoresis. The patient did not show any signs of any acute illness, ROS is negative the physical exam is normal. At  this point I do feel she is safe for further outpatient workup, I will have her follow up with neurologist for reevaluation and her family doctor for a recheck, however at this point in time it does not appear that further diagnostic testing or other treatment is in order. Final diagnoses:  Light headedness  Nausea  Diaphoresis        Arby Barrette, MD 09/13/13 1452

## 2013-09-13 NOTE — ED Notes (Signed)
Pt placed in room and placed on heart monitor. Axo x4

## 2013-09-13 NOTE — ED Notes (Signed)
Per EMS: Pt with hx of TIA, was at school today when she started to notice right arm weakness, and mild confusion. Pt also reports some nausea and diaphoresis, reports similar symptoms with TIA in past. EMS noted no neuro deficits upon arrival, pt denies any nausea at this time. nad noted. axox 4

## 2014-11-16 NOTE — Telephone Encounter (Signed)
Error

## 2015-03-30 ENCOUNTER — Other Ambulatory Visit: Payer: Self-pay | Admitting: Family Medicine

## 2015-03-30 DIAGNOSIS — Z1231 Encounter for screening mammogram for malignant neoplasm of breast: Secondary | ICD-10-CM

## 2015-04-11 ENCOUNTER — Ambulatory Visit: Payer: BC Managed Care – PPO

## 2015-04-16 ENCOUNTER — Ambulatory Visit
Admission: RE | Admit: 2015-04-16 | Discharge: 2015-04-16 | Disposition: A | Discharge status: Home / Self Care | Payer: BC Managed Care – PPO | Source: Ambulatory Visit | Attending: Family Medicine | Admitting: Family Medicine

## 2015-04-16 DIAGNOSIS — Z1231 Encounter for screening mammogram for malignant neoplasm of breast: Secondary | ICD-10-CM

## 2015-04-19 ENCOUNTER — Ambulatory Visit (INDEPENDENT_AMBULATORY_CARE_PROVIDER_SITE_OTHER): Payer: BC Managed Care – PPO | Admitting: Neurology

## 2015-04-19 ENCOUNTER — Encounter: Payer: Self-pay | Admitting: Neurology

## 2015-04-19 VITALS — BP 114/72 | HR 72 | Resp 14 | Ht 63.0 in | Wt 144.0 lb

## 2015-04-19 DIAGNOSIS — G43109 Migraine with aura, not intractable, without status migrainosus: Secondary | ICD-10-CM

## 2015-04-19 DIAGNOSIS — G43909 Migraine, unspecified, not intractable, without status migrainosus: Secondary | ICD-10-CM | POA: Diagnosis not present

## 2015-04-19 NOTE — Patient Instructions (Signed)
Your workup in the past was fine, your exam looks good.   Please remember to try to maintain good sleep hygiene, which means: Keep a regular sleep and wake schedule, try not to exercise or have a meal within 2 hours of your bedtime, try to keep your bedroom conducive for sleep, that is, cool and dark, without light distractors such as an illuminated alarm clock, and refrain from watching TV right before sleep or in the middle of the night and do not keep the TV or radio on during the night. Also, try not to use or play on electronic devices at bedtime, such as your cell phone, tablet PC or laptop. If you like to read at bedtime on an electronic device, try to dim the background light as much as possible. Do not eat in the middle of the night.   Please remember, common headache triggers are: sleep deprivation, dehydration, overheating, stress, hypoglycemia or skipping meals and blood sugar fluctuations, excessive pain medications or excessive alcohol use or caffeine withdrawal. Some people have food triggers such as aged cheese, orange juice or chocolate, especially dark chocolate, or MSG (monosodium glutamate). Try to avoid these headache triggers as much possible. It may be helpful to keep a headache diary to figure out what makes your headaches worse or brings them on and what alleviates them. Some people report headache onset after exercise but studies have shown that regular exercise may actually prevent headaches from coming. If you have exercise-induced headaches, please make sure that you drink plenty of fluid before and after exercising and that you do not over do it and do not overheat.  I can see you back as needed.

## 2015-04-19 NOTE — Progress Notes (Signed)
Subjective:    Garner ID: Terri Garner is a 45 y.o. female.  HPI     Interim history:   Terri Garner is a 45 year old right-handed woman with an underlying medical history of irritable bowel syndrome, arthritis, elevated blood sugar values, who presents for follow-up consultation after 2-1/2 years. Terri Garner is unaccompanied today. I've seen her 1 time on 10/05/2012 after an emergency room visit from October 2014. Terri Garner had presented to [facility] ER with new onset occipital headaches associated with nausea and left upper extremity and left lower extremity clumsiness and weakness as well as left facial droop. These neurological presentations resolved after several minutes. Exam was nonfocal at Terri time. I thought Terri Garner may have had a computer migraine. I suggested workup in Terri form of sleep study, blood work and an EEG.   EEG from 10/12/2012 was normal in Terri awake and drowsy states.   Terri Garner had a baseline sleep study on 10/16/2012. This showed a sleep efficiency of 87.4% with a latency to sleep of 23.5 minutes and wake after sleep onset of 33 minutes with mild to moderate sleep fragmentation noted. Arousal index was 12.5 per hour. Terri Garner had an increased percentage of light stage sleep, a decreased percentage of slow-wave sleep at 5.5% and a decreased percentage of REM sleep at 16.4% with a prolonged REM latency of 145.5 minutes. Terri Garner had a moderate PLM index of 36.4 per hour, resulting in 6.7 arousals per hour. Average oxygen saturation was 95%, nadir was 91%, no significant snoring was noted.   We called her with her EEG and sleep study results.  Blood work from 10/05/2012 showed normal hypercoagulable panel, normal TSH, normal ESR, normal RPR, normal ANA, normal CRP, normal rheumatoid factor and normal homocysteine panel.  Terri Garner did not schedule a follow-up appointment.  Today, 04/19/2015: Terri Garner reports no new concerns, had a routine physical recent, a knot was felt in Terri back of her L neck. Terri Garner has  been doing well. 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 Terri Garner tries to stay active, cannot always drink fluids during Terri work day. Sometimes Terri Garner has tenderness in her neck, but does admit that it could be stress related. Terri Garner is single, lives alone, has no children. Terri Garner does not smoke and drinks alcohol very rarely. Terri Garner likes to drink tea, no coffee.  Previously:  10/05/2012: Terri Garner presented to [facility] emergency room on 09/16/2012 with new onset occipital headache associated with nausea and Terri Garner developed left upper extremity and left lower extremity clumsiness and weakness as well as left facial droop. This resolved after an several minutes. At Terri time of ER presentation Terri Garner was nonfocal and had resolution of her weakness but had a headache. Terri Garner was transferred to [facility] emergency room for further workup. CT head was negative. Terri Garner had mild headache. Terri Garner denied any chest pain or shortness of breath or palpitations or any prior similar episodes. Terri Garner was placed on telemetry and had a TIA workup which included a CT head, MRI brain, MRA head, carotid Doppler, echocardiogram, all of which were unremarkable. Terri Garner was noted to have mild hyperlipidemia and a borderline hemoglobin A1c. Terri Garner was told that Terri Garner could have had a TIA versus migraine with complications. Terri Garner was placed on a baby aspirin, which Terri Garner has been taking since then without problems. Terri Garner does not endorse a significantly stressful day, when Terri Garner presented.  Terri Garner is symptom free and has had no further headaches. Terri Garner has never had a migraine before or TIA-like Sx. There is  no FHx of migraines. Terri Garner reports snoring and has woken herself up from her snoring. Terri Garner has a FHx of OSA in her father. Terri Garner has never had a PSG. Terri Garner endorses EDS and her ESS is 11/24 today. Never had diplopia or VF cut, or sudden decrease in vision.   Her BT is around 9 PM, and her wake time is 5-6 AM. Terri Garner has a difficult time waking up lately. Terri Garner uses 2 alarms. Terri Garner did have a all over  trembling at Terri time; Terri Garner has not had an EEG.  Her Past Medical History Is Significant For: Past Medical History  Diagnosis Date  . Arthritis     L hip  . TIA (transient ischemic attack)     Her Past Surgical History Is Significant For: Past Surgical History  Procedure Laterality Date  . None      Her Family History Is Significant For: Family History  Problem Relation Age of Onset  . Diabetes Father   . Heart attack Father     Her Social History Is Significant For: Social History   Social History  . Marital Status: Divorced    Spouse Name: N/A  . Number of Children: 0  . Years of Education: college   Occupational History  .  [place] History Main Topics  . Smoking status: Never Smoker   . Smokeless tobacco: None  . Alcohol Use: No  . Drug Use: No  . Sexual Activity:    Partners: Male    Birth Control/ Protection: None   Other Topics Concern  . None   Social History Narrative    Her Allergies Are:  Allergies  Allergen Reactions  . Amoxicillin     rash  :   Her Current Medications Are:  Outpatient Encounter Prescriptions as of 04/19/2015  Medication Sig  . aspirin 81 MG tablet Take 81 mg by mouth daily.  Marland Kitchen ibuprofen (ADVIL,MOTRIN) 200 MG tablet Take 400 mg by mouth daily as needed for cramping.  . Multiple Vitamins-Minerals (MULTIVITAMIN GUMMIES ADULT) CHEW Chew 2 tablets by mouth daily.  . [DISCONTINUED] aspirin EC 81 MG tablet Take 81 mg by mouth daily.   No facility-administered encounter medications on file as of 04/19/2015.  :  Review of Systems:  Out of a complete 14 point review of systems, all are reviewed and negative with Terri exception of these symptoms as listed below:   Review of Systems  Neurological:       No new concerns per Garner.     Objective:  Neurologic Exam  Physical Exam Physical Examination:   Filed Vitals:   04/19/15 0825  BP: 114/72  Pulse: 72  Resp: 14   General Examination: Terri  Garner is a very pleasant 45 y.o. female in no acute distress. Terri Garner appears well-developed and well-nourished and well groomed.   HEENT: Normocephalic, atraumatic, pupils are equal, round and reactive to light and accommodation. Funduscopic exam is normal with sharp disc margins noted. Extraocular tracking is good without limitation to gaze excursion or nystagmus noted. Normal smooth pursuit is noted. Hearing is grossly intact. Face is symmetric with normal facial animation and normal facial sensation. Speech is clear with no dysarthria noted. There is no hypophonia. There is no lip, neck/head, jaw or voice tremor. Neck is supple with full range of passive and active motion. Terri Garner has just a little bit more muscle tightness in Terri right lateral neck and left posterior neck, not tender to palpation. There are no  carotid bruits on auscultation. Oropharynx exam reveals: mild mouth dryness, adequate dental hygiene and mild airway crowding, due to narrow airway entry. Mallampati is class II. Tongue protrudes centrally and palate elevates symmetrically. Tonsils are 1+.    Chest: Clear to auscultation without wheezing, rhonchi or crackles noted.  Heart: S1+S2+0, regular and normal without murmurs, rubs or gallops noted.   Abdomen: Soft, non-tender and non-distended with normal bowel sounds appreciated on auscultation.  Extremities: There is no pitting edema in Terri distal lower extremities bilaterally. Pedal pulses are intact.  Skin: Warm and dry without trophic changes noted. There are no varicose veins.  Musculoskeletal: exam reveals no obvious joint deformities, tenderness or joint swelling or erythema.   Neurologically:  Mental status: Terri Garner is awake, alert and oriented in all 4 spheres. Her memory, attention, language and knowledge are appropriate. There is no aphasia, agnosia, apraxia or anomia. Speech is clear with normal prosody and enunciation. Thought process is linear. Mood is congruent and  affect is normal.  Cranial nerves are as described above under HEENT exam. In addition, shoulder shrug is normal with equal shoulder height noted. Motor exam: Normal bulk, strength and tone is noted. There is no drift, tremor or rebound. Romberg is negative. Reflexes are 2+ throughout. Toes are downgoing bilaterally. Fine motor skills are intact with normal finger taps, normal hand movements, normal rapid alternating patting, normal foot taps and normal foot agility.  Cerebellar testing shows no dysmetria or intention tremor on finger to nose testing. Heel to shin is unremarkable bilaterally. There is no truncal or gait ataxia.  Sensory exam is intact to light touch, pinprick, vibration, temperature sense in Terri upper and lower extremities.  Gait, station and balance are unremarkable. No veering to one side is noted. No leaning to one side is noted. Posture is age-appropriate and stance is narrow based. No problems turning are noted. Terri Garner turns en bloc. Tandem walk is unremarkable.             Assessment and Plan:   In summary, Terri Garner is a very pleasant 45 year old female with a benign medical Hx other than overweight state and pre-diabetes and who had an episode in 2014 of left facial weakness and left occipital headache, associated with brief speech impairment, all of which resolved after minutes, headache lasted longer. Her history and exam were in keeping with complicated migraine, and Terri Garner had extensive workup for Terri differential diagnosis of TIA. In addition, I did blood work, sleep study, and EEG, all of which were benign. Her sleep study showed moderate limb movements associated with mild arousals. Terri Garner does not have any significant RLS symptoms. Terri Garner has been doing fine. Terri Garner had a routine physical exam recently and I'm not was felt in Terri left posterior neck. Terri Garner may have some muscle tension there. Terri Garner does endorse stress and does not always allow for enough sleep time and does not always  hydrate well enough. Terri Garner's advised about sleep hygiene, headache triggers, and advised to stay active mentally, physically and we talked about general health measures. Terri Garner does take a baby aspirin and has been tolerating this well. Terri Garner can continue taking a baby aspirin for prevention since Terri Garner does have a family history of heart disease and stroke on her father's side.  Her exam looks good. Terri Garner tries to exercise regularly. At this juncture, I can see her back on an as-needed basis. I answered all her questions today and Terri Garner was in agreement.  I spent 25  minutes in total face-to-face time with Terri Garner, more than 50% of which was spent in counseling and coordination of care, reviewing test results, reviewing medication and discussing or reviewing Terri diagnosis of complicated migraine, its prognosis and treatment options.

## 2019-08-22 ENCOUNTER — Other Ambulatory Visit: Payer: Self-pay | Admitting: Family Medicine

## 2019-08-22 DIAGNOSIS — Z1231 Encounter for screening mammogram for malignant neoplasm of breast: Secondary | ICD-10-CM

## 2019-10-03 ENCOUNTER — Ambulatory Visit
Admission: RE | Admit: 2019-10-03 | Discharge: 2019-10-03 | Disposition: A | Discharge status: Home / Self Care | Payer: BC Managed Care – PPO | Source: Ambulatory Visit | Attending: Family Medicine | Admitting: Family Medicine

## 2019-10-03 ENCOUNTER — Other Ambulatory Visit: Payer: Self-pay

## 2019-10-03 DIAGNOSIS — Z1231 Encounter for screening mammogram for malignant neoplasm of breast: Secondary | ICD-10-CM

## 2021-06-02 IMAGING — MG DIGITAL SCREENING BILAT W/ TOMO W/ CAD
8 series · 9 of 24 positions shown · non-contrast
Comparison: Previous exam(s).

CLINICAL DATA: Screening.

EXAM:
DIGITAL SCREENING BILATERAL MAMMOGRAM WITH TOMO AND CAD

[L MLO synth-2D]
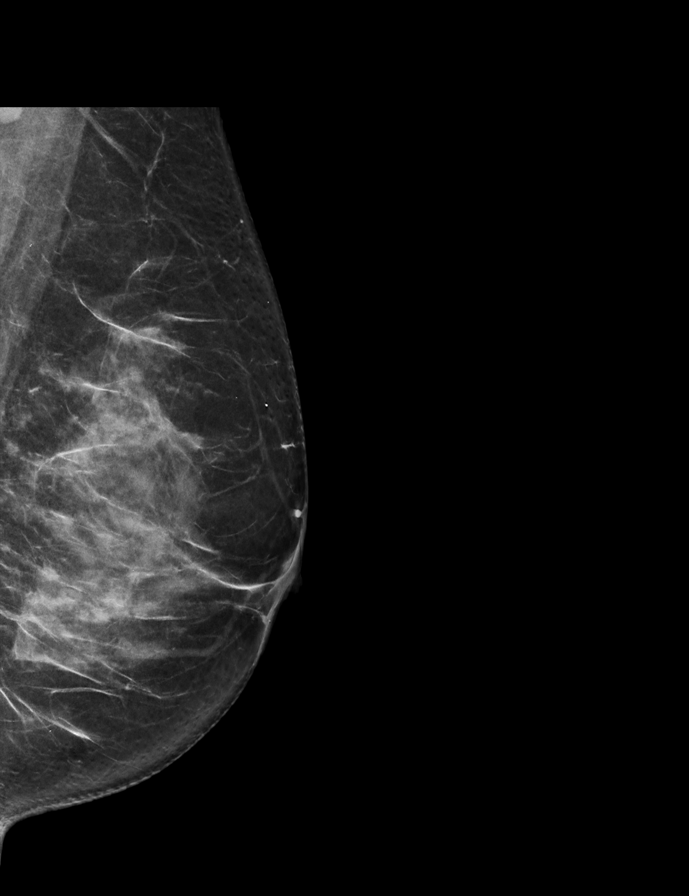

[R CC synth-2D]
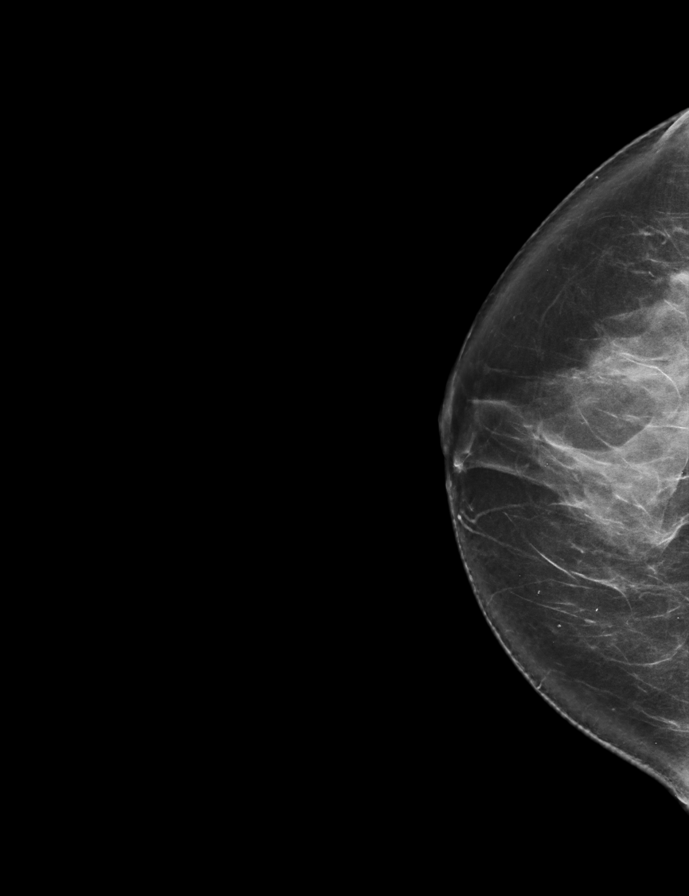

[L CC synth-2D]
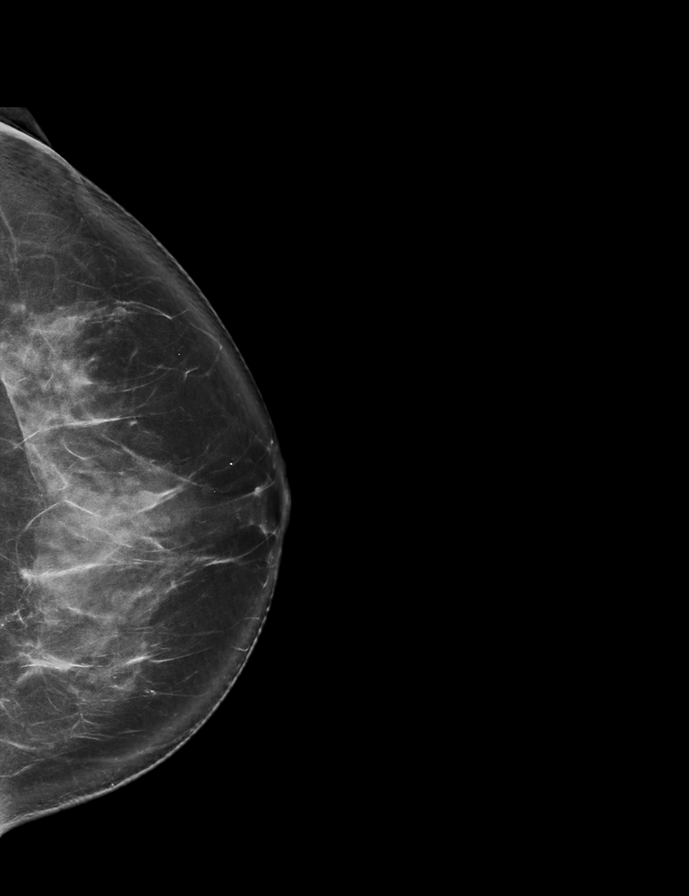

[R MLO synth-2D]
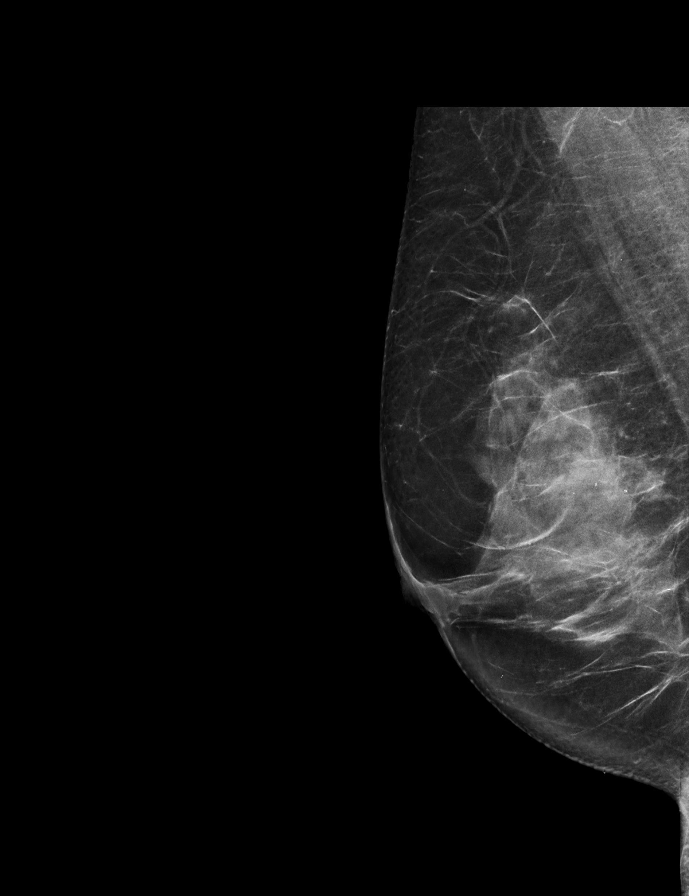

[R CC tomo · 2 of 73 frames shown]
[frame 24/73]
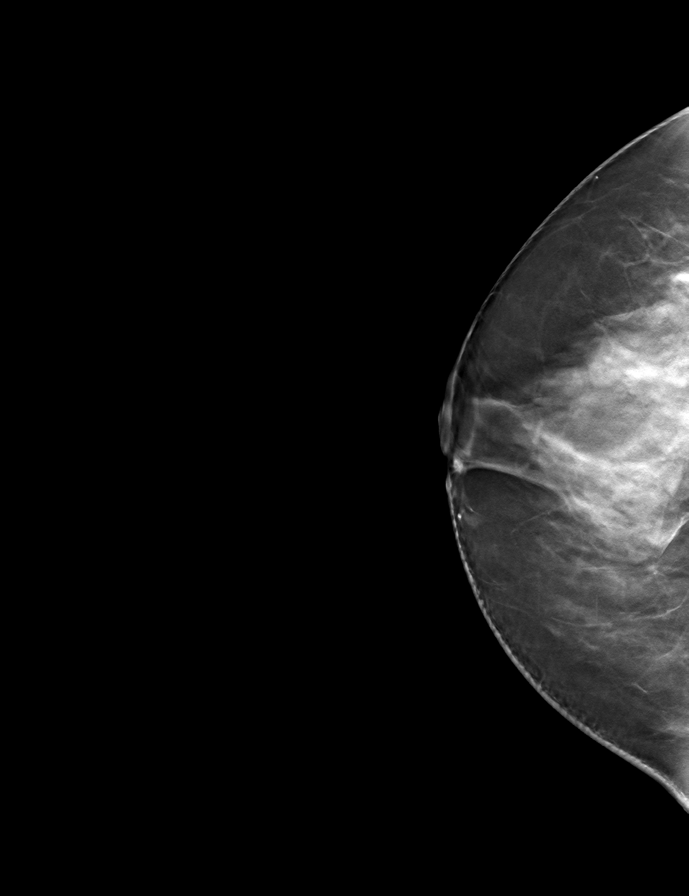
[frame 37/73]
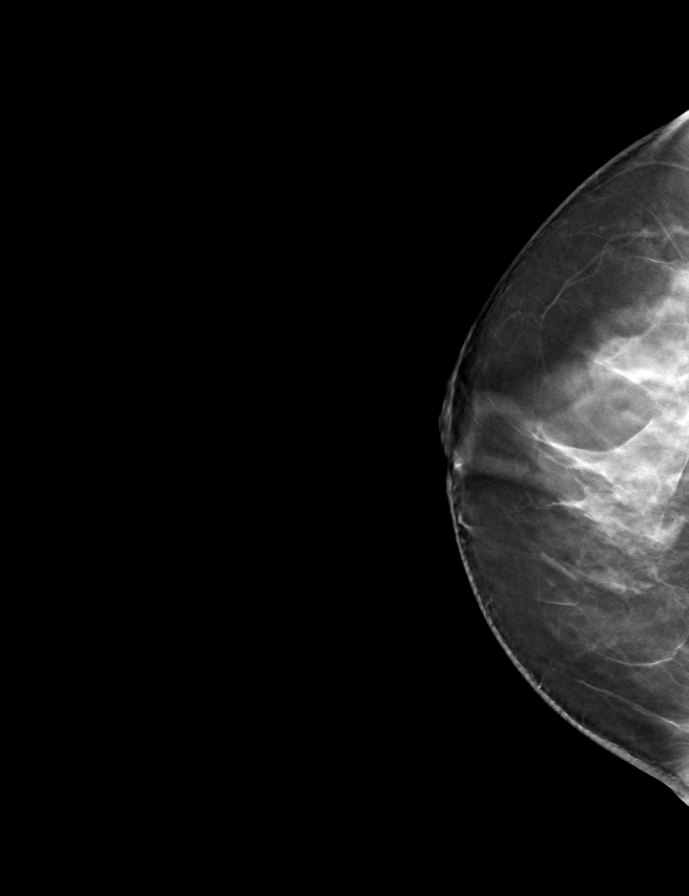

[L MLO tomo · tomo slice 35/70.0]
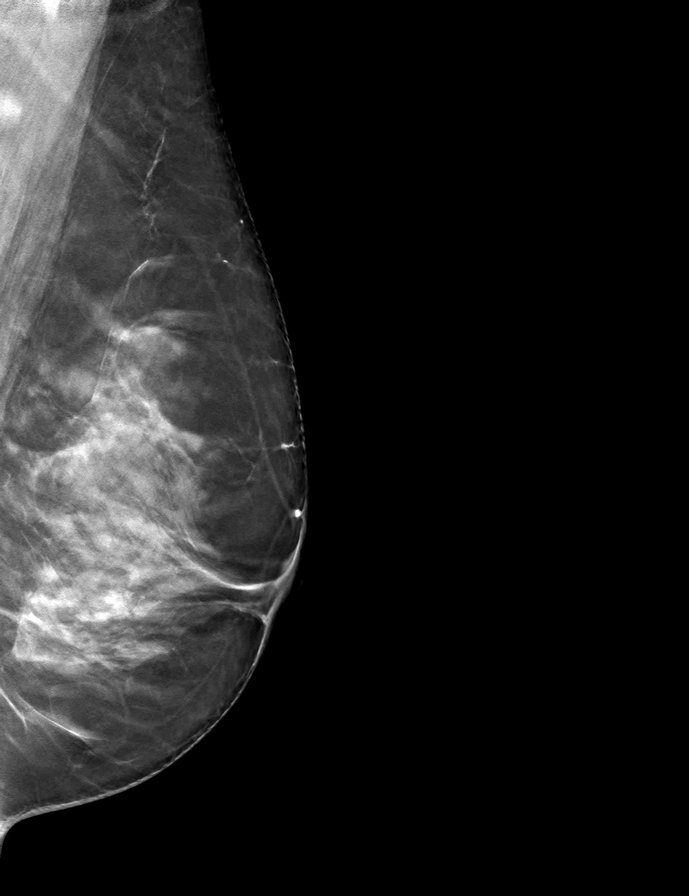

[L CC tomo · tomo slice 40/79.0]
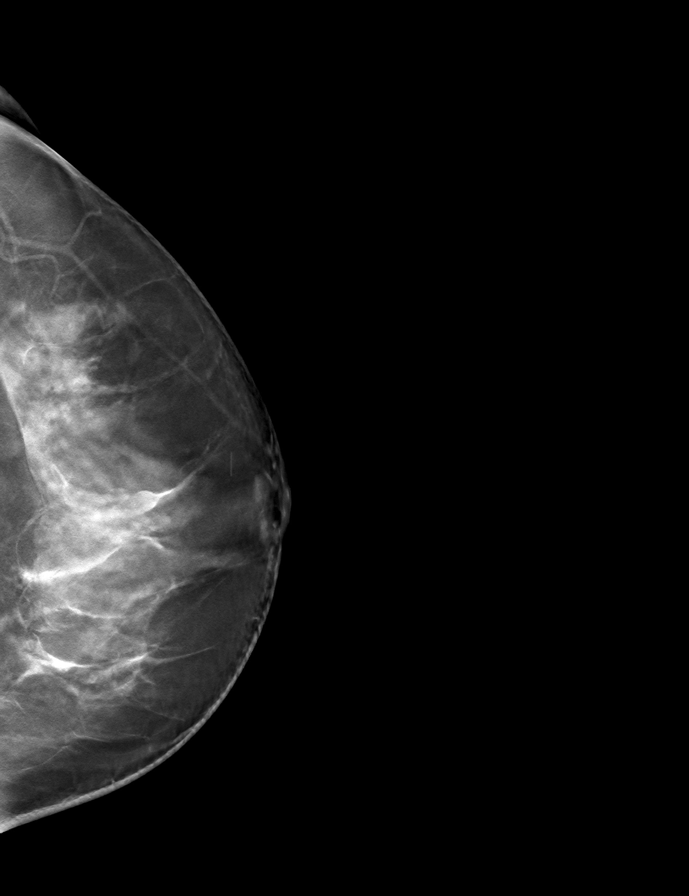

[R MLO tomo · tomo slice 37/72.0]
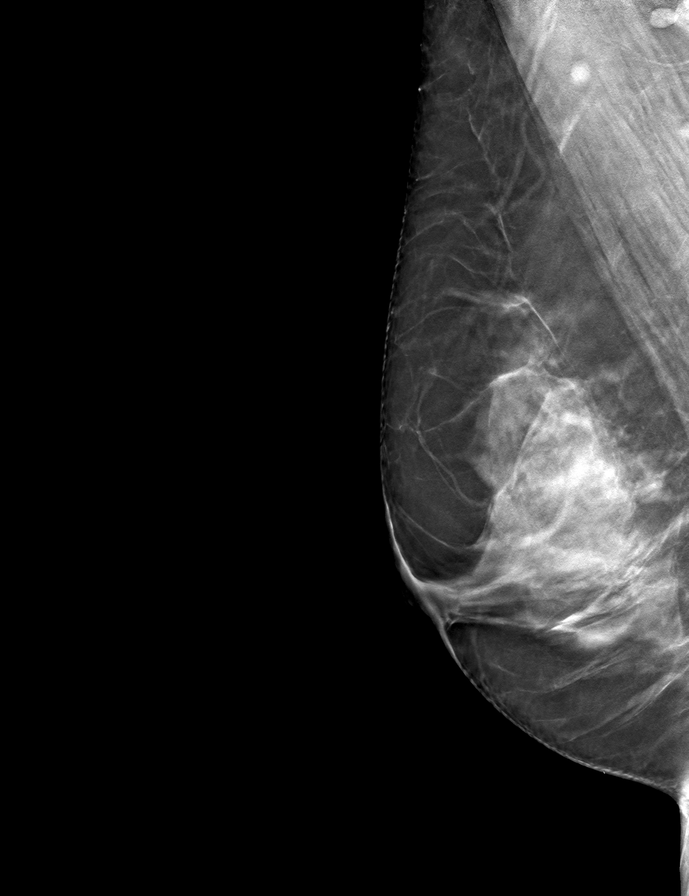

[9 of 24 positions shown; findings below may reference images not displayed]

ACR Breast Density Category c: The breast tissue is heterogeneously
dense, which may obscure small masses.
FINDINGS: There are no findings suspicious for malignancy. Images were
processed with CAD.
IMPRESSION: No mammographic evidence of malignancy. A result letter of this
screening mammogram will be mailed directly to the patient.

RECOMMENDATION:
Screening mammogram in one year. (Code:FT-U-LHB)

BI-RADS CATEGORY  1: Negative.

## 2021-08-13 ENCOUNTER — Other Ambulatory Visit: Payer: Self-pay | Admitting: Family Medicine

## 2021-08-13 DIAGNOSIS — Z1231 Encounter for screening mammogram for malignant neoplasm of breast: Secondary | ICD-10-CM

## 2021-08-22 ENCOUNTER — Ambulatory Visit
Admission: RE | Admit: 2021-08-22 | Discharge: 2021-08-22 | Disposition: A | Discharge status: Home / Self Care | Payer: BC Managed Care – PPO | Source: Ambulatory Visit | Attending: Family Medicine | Admitting: Family Medicine

## 2021-08-22 DIAGNOSIS — Z1231 Encounter for screening mammogram for malignant neoplasm of breast: Secondary | ICD-10-CM

## 2021-08-26 ENCOUNTER — Other Ambulatory Visit: Payer: Self-pay | Admitting: Family Medicine

## 2021-08-26 DIAGNOSIS — R928 Other abnormal and inconclusive findings on diagnostic imaging of breast: Secondary | ICD-10-CM

## 2021-08-30 ENCOUNTER — Ambulatory Visit
Admission: RE | Admit: 2021-08-30 | Discharge: 2021-08-30 | Disposition: A | Discharge status: Home / Self Care | Payer: BC Managed Care – PPO | Source: Ambulatory Visit | Attending: Family Medicine | Admitting: Family Medicine

## 2021-08-30 DIAGNOSIS — R928 Other abnormal and inconclusive findings on diagnostic imaging of breast: Secondary | ICD-10-CM

## 2022-08-22 ENCOUNTER — Other Ambulatory Visit: Payer: Self-pay | Admitting: Family Medicine

## 2022-08-22 DIAGNOSIS — Z1231 Encounter for screening mammogram for malignant neoplasm of breast: Secondary | ICD-10-CM

## 2022-09-05 ENCOUNTER — Ambulatory Visit
Admission: RE | Admit: 2022-09-05 | Discharge: 2022-09-05 | Disposition: A | Discharge status: Home / Self Care | Payer: BC Managed Care – PPO | Source: Ambulatory Visit | Attending: Family Medicine | Admitting: Family Medicine

## 2022-09-05 DIAGNOSIS — Z1231 Encounter for screening mammogram for malignant neoplasm of breast: Secondary | ICD-10-CM

## 2023-08-11 ENCOUNTER — Other Ambulatory Visit: Payer: Self-pay | Admitting: Family Medicine

## 2023-08-11 DIAGNOSIS — Z1231 Encounter for screening mammogram for malignant neoplasm of breast: Secondary | ICD-10-CM

## 2023-09-07 ENCOUNTER — Ambulatory Visit
Admission: RE | Admit: 2023-09-07 | Discharge: 2023-09-07 | Disposition: A | Discharge status: Home / Self Care | Source: Ambulatory Visit

## 2023-09-07 DIAGNOSIS — Z1231 Encounter for screening mammogram for malignant neoplasm of breast: Secondary | ICD-10-CM
